# Patient Record
Sex: Female | Born: 1980 | Race: Black or African American | Hispanic: No | Marital: Single
Health system: Southern US, Community
[De-identification: ages and names within clinical notes are randomized; demographics above are authoritative.]

## PROBLEM LIST (undated history)

## (undated) ENCOUNTER — Inpatient Hospital Stay (HOSPITAL_COMMUNITY): Payer: Self-pay

## (undated) DIAGNOSIS — O24419 Gestational diabetes mellitus in pregnancy, unspecified control: Secondary | ICD-10-CM

## (undated) DIAGNOSIS — R87629 Unspecified abnormal cytological findings in specimens from vagina: Secondary | ICD-10-CM

## (undated) DIAGNOSIS — A5901 Trichomonal vulvovaginitis: Secondary | ICD-10-CM

## (undated) HISTORY — PX: NO PAST SURGERIES: SHX2092

## (undated) HISTORY — DX: Gestational diabetes mellitus in pregnancy, unspecified control: O24.419

---

## 2005-11-19 ENCOUNTER — Emergency Department (HOSPITAL_COMMUNITY): Admission: EM | Admit: 2005-11-19 | Discharge: 2005-11-19 | Payer: Self-pay | Admitting: Emergency Medicine

## 2008-02-21 ENCOUNTER — Emergency Department (HOSPITAL_COMMUNITY): Admission: EM | Admit: 2008-02-21 | Discharge: 2008-02-21 | Payer: Self-pay | Admitting: Emergency Medicine

## 2010-05-18 ENCOUNTER — Emergency Department (HOSPITAL_BASED_OUTPATIENT_CLINIC_OR_DEPARTMENT_OTHER)
Admission: EM | Admit: 2010-05-18 | Discharge: 2010-05-18 | Disposition: A | Discharge status: Home / Self Care | Payer: Self-pay | Attending: Emergency Medicine | Admitting: Emergency Medicine

## 2010-05-18 DIAGNOSIS — W2209XA Striking against other stationary object, initial encounter: Secondary | ICD-10-CM | POA: Insufficient documentation

## 2010-05-18 DIAGNOSIS — S61209A Unspecified open wound of unspecified finger without damage to nail, initial encounter: Secondary | ICD-10-CM | POA: Insufficient documentation

## 2010-10-11 ENCOUNTER — Emergency Department (HOSPITAL_BASED_OUTPATIENT_CLINIC_OR_DEPARTMENT_OTHER)
Admission: EM | Admit: 2010-10-11 | Discharge: 2010-10-11 | Disposition: A | Discharge status: Home / Self Care | Payer: BC Managed Care – PPO | Attending: Emergency Medicine | Admitting: Emergency Medicine

## 2010-10-11 DIAGNOSIS — N390 Urinary tract infection, site not specified: Secondary | ICD-10-CM

## 2010-10-11 DIAGNOSIS — R350 Frequency of micturition: Secondary | ICD-10-CM | POA: Insufficient documentation

## 2010-10-11 LAB — URINALYSIS, ROUTINE W REFLEX MICROSCOPIC
Glucose, UA: NEGATIVE mg/dL
Nitrite: POSITIVE — AB
Protein, ur: 30 mg/dL — AB

## 2010-10-11 LAB — URINE MICROSCOPIC-ADD ON

## 2010-10-11 MED ORDER — NITROFURANTOIN MONOHYD MACRO 100 MG PO CAPS
100.0000 mg | ORAL_CAPSULE | Freq: Once | ORAL | Status: AC
  Administered 2010-10-11: 100 mg via ORAL
  Filled 2010-10-11: qty 1

## 2010-10-11 MED ORDER — PHENAZOPYRIDINE HCL 200 MG PO TABS: 200.0000 mg | ORAL_TABLET | Freq: Three times a day (TID) | ORAL | Status: AC

## 2010-10-11 MED ORDER — NITROFURANTOIN MONOHYD MACRO 100 MG PO CAPS: 100.0000 mg | ORAL_CAPSULE | Freq: Two times a day (BID) | ORAL | Status: AC

## 2010-10-11 NOTE — ED Provider Notes (Signed)
History     CSN: [id number] Arrival date & time: 10/11/2010  2:08 AM  Chief Complaint  Patient presents with  . Urinary Frequency   HPI Comments: Dysuria and urinary frequency x1 day. Instructed over the counter these without significant relief. No prior history of urinary tract infections. She has no associated nausea, vomiting, fever, flank pain, suprapubic tenderness. There is no vaginal discharge or bleeding  Patient is a 30 y.o. female presenting with frequency. The history is provided by the patient. No language interpreter was used.  Urinary Frequency This is a new problem. The current episode started yesterday. The problem occurs constantly. The problem has been gradually worsening. Pertinent negatives include no chest pain, no abdominal pain, no headaches and no shortness of breath. Exacerbated by: urinating. The symptoms are relieved by nothing. Treatments tried: azo. The treatment provided no relief.    History reviewed. No pertinent past medical history.  History reviewed. No pertinent past surgical history.  History reviewed. No pertinent family history.  History  Substance Use Topics  . Smoking status: Never Smoker   . Smokeless tobacco: Never Used  . Alcohol Use: Yes     social drinker    OB History    Grav Para Term Preterm Abortions TAB SAB Ect Mult Living                  Review of Systems  Constitutional: Negative for fever, activity change, appetite change and fatigue.  HENT: Negative for sore throat, neck pain and neck stiffness.   Respiratory: Negative for chest tightness and shortness of breath.   Cardiovascular: Negative for chest pain and palpitations.  Gastrointestinal: Negative for nausea, vomiting and abdominal pain.  Genitourinary: Positive for dysuria, urgency and frequency. Negative for hematuria, flank pain, vaginal bleeding and vaginal discharge.  Neurological: Negative for dizziness, weakness, light-headedness, numbness and headaches.  All  other systems reviewed and are negative.    Physical Exam  BP 94/56  Pulse 76  Temp(Src) 98.2 F (36.8 C) (Oral)  Resp 16  Ht 5\' 5"  (1.651 m)  Wt 150 lb (68.04 kg)  BMI 24.96 kg/m2  SpO2 100%  LMP 09/20/2010  Physical Exam  Nursing note and vitals reviewed. Constitutional: She is oriented to person, place, and time. She appears well-developed and well-nourished. No distress.  HENT:  Head: Normocephalic and atraumatic.  Eyes: Conjunctivae are normal. Pupils are equal, round, and reactive to light.  Neck: Normal range of motion. Neck supple.  Cardiovascular: Normal rate, regular rhythm, normal heart sounds and intact distal pulses.  Exam reveals no gallop and no friction rub.   No murmur heard. Pulmonary/Chest: Effort normal and breath sounds normal. No respiratory distress.  Abdominal: Soft. Bowel sounds are normal. There is no tenderness.       No cva tenderness  Musculoskeletal: Normal range of motion.  Neurological: She is alert and oriented to person, place, and time.  Skin: Skin is warm and dry. No rash noted.    ED Course  Procedures  MDM  UTI Patient received her first dose of antibiotics and numerous department. I'll prescribe her Macrobid. A urine culture was sent. She has no prior history of urinary tract infections this will be uncomplicated. I will discharge her home with a prescription for Macrobid as well as Pyridium. She is provided instructions for which to return to the emergency department such as fever, flank pain, nausea, vomiting. She's instructed to followup with her primary care physician in one week as needed.  Dayton Bailiff, MD 10/11/10 [phone]

## 2010-10-11 NOTE — ED Notes (Signed)
Pt states that she had onset of discomfort with urination yesterday.  Pt states that urine has foul odor to it.  Pt states that she has been taking Azo for the discomfort, no improvement.

## 2010-10-13 LAB — URINE CULTURE: Colony Count: >=100000

## 2011-09-22 ENCOUNTER — Encounter (HOSPITAL_BASED_OUTPATIENT_CLINIC_OR_DEPARTMENT_OTHER): Payer: Self-pay | Admitting: Emergency Medicine

## 2011-09-22 ENCOUNTER — Emergency Department (HOSPITAL_BASED_OUTPATIENT_CLINIC_OR_DEPARTMENT_OTHER)
Admission: EM | Admit: 2011-09-22 | Discharge: 2011-09-22 | Disposition: A | Discharge status: Home / Self Care | Payer: Self-pay | Attending: Emergency Medicine | Admitting: Emergency Medicine

## 2011-09-22 DIAGNOSIS — N390 Urinary tract infection, site not specified: Secondary | ICD-10-CM | POA: Insufficient documentation

## 2011-09-22 LAB — URINALYSIS, ROUTINE W REFLEX MICROSCOPIC
Bilirubin Urine: NEGATIVE
Ketones, ur: NEGATIVE mg/dL
Nitrite: NEGATIVE
Protein, ur: NEGATIVE mg/dL
Specific Gravity, Urine: 1.017 (ref 1.005–1.030)
Urobilinogen, UA: 0.2 mg/dL (ref 0.0–1.0)

## 2011-09-22 LAB — PREGNANCY, URINE: Preg Test, Ur: NEGATIVE

## 2011-09-22 MED ORDER — CEPHALEXIN 500 MG PO CAPS: 500.0000 mg | ORAL_CAPSULE | Freq: Four times a day (QID) | ORAL | Status: AC

## 2011-09-22 NOTE — ED Notes (Signed)
Pt states that about 1 week ago she noticed a smell to her urine and pain started 3 days ago.

## 2011-09-22 NOTE — ED Provider Notes (Signed)
History     CSN: [id number]  Arrival date & time 09/22/11  1044   First MD Initiated Contact with Patient 09/22/11 1054      Chief Complaint  Patient presents with  . Urinary Tract Infection    (Consider location/radiation/quality/duration/timing/severity/associated sxs/prior treatment) HPI  Patient states that she has a urinary tract infection. She states that she noted a bad odor to her urine a week ago and took over-the-counter medication. She noted pain with urination that began 3 days ago. She has not had any pain, fever, chills, nausea or vomiting. Her last normal menstrual period was the beginning of August. She is sexually active but denies any vaginal discharge. She has had one urinary tract infection over a year ago.  History reviewed. No pertinent past medical history.  History reviewed. No pertinent past surgical history.  No family history on file.  History  Substance Use Topics  . Smoking status: Never Smoker   . Smokeless tobacco: Never Used  . Alcohol Use: Yes     social drinker    OB History    Grav Para Term Preterm Abortions TAB SAB Ect Mult Living                  Review of Systems  All other systems reviewed and are negative.    Allergies  Review of patient's allergies indicates no known allergies.  Home Medications   Current Outpatient Rx  Name Route Sig Dispense Refill  . LYSINE ACETATE PO Oral Take 2 tablets by mouth.      . AZO TABS PO Oral Take 2 tablets by mouth.        BP 116/75  Pulse 70  Temp 98.2 F (36.8 C) (Oral)  Resp 18  Ht 5\' 5"  (1.651 m)  Wt 150 lb (68.04 kg)  BMI 24.96 kg/m2  SpO2 100%  LMP 09/07/2011  Physical Exam  Nursing note and vitals reviewed. Constitutional: She is oriented to person, place, and time. She appears well-developed and well-nourished.  HENT:  Head: Normocephalic and atraumatic.  Eyes: Pupils are equal, round, and reactive to light.  Cardiovascular: Normal rate.   Pulmonary/Chest: Effort  normal.  Abdominal: Soft. Bowel sounds are normal.  Musculoskeletal:       No CVA tenderness.  Neurological: She is alert and oriented to person, place, and time.  Skin: Skin is warm and dry.  Psychiatric: She has a normal mood and affect.    ED Course  Procedures (including critical care time)  Labs Reviewed  URINALYSIS, ROUTINE W REFLEX MICROSCOPIC - Abnormal; Notable for the following:    APPearance CLOUDY (*)     Hgb urine dipstick SMALL (*)     Leukocytes, UA SMALL (*)     All other components within normal limits  URINE MICROSCOPIC-ADD ON - Abnormal; Notable for the following:    Squamous Epithelial / LPF FEW (*)     Bacteria, UA MANY (*)     All other components within normal limits  PREGNANCY, URINE   No results found.   No diagnosis found.    MDM  Discussed antibiotics with patient and she requests 3 day course. Plan Keflex. Patient advised of need to followup her symptoms return or she is worse anytime.       Hilario Quarry, MD 09/22/11 1137

## 2015-06-05 ENCOUNTER — Encounter (HOSPITAL_BASED_OUTPATIENT_CLINIC_OR_DEPARTMENT_OTHER): Payer: Self-pay | Admitting: *Deleted

## 2015-06-05 ENCOUNTER — Emergency Department (HOSPITAL_BASED_OUTPATIENT_CLINIC_OR_DEPARTMENT_OTHER)
Admission: EM | Admit: 2015-06-05 | Discharge: 2015-06-05 | Disposition: A | Discharge status: Home / Self Care | Payer: Self-pay | Attending: Emergency Medicine | Admitting: Emergency Medicine

## 2015-06-05 DIAGNOSIS — R112 Nausea with vomiting, unspecified: Secondary | ICD-10-CM | POA: Insufficient documentation

## 2015-06-05 DIAGNOSIS — R519 Headache, unspecified: Secondary | ICD-10-CM

## 2015-06-05 DIAGNOSIS — R51 Headache: Secondary | ICD-10-CM | POA: Insufficient documentation

## 2015-06-05 MED ORDER — IBUPROFEN 800 MG PO TABS
800.0000 mg | ORAL_TABLET | Freq: Once | ORAL | Status: AC
  Administered 2015-06-05: 800 mg via ORAL
  Filled 2015-06-05: qty 1

## 2015-06-05 MED ORDER — ONDANSETRON 4 MG PO TBDP: 4.0000 mg | ORAL_TABLET | Freq: Three times a day (TID) | ORAL | Status: DC | PRN

## 2015-06-05 MED ORDER — IBUPROFEN 100 MG/5ML PO SUSP: 800.0000 mg | Freq: Once | ORAL | Status: DC

## 2015-06-05 MED ORDER — IBUPROFEN 800 MG PO TABS: 800.0000 mg | ORAL_TABLET | Freq: Three times a day (TID) | ORAL | Status: DC

## 2015-06-05 MED ORDER — ONDANSETRON 4 MG PO TBDP
4.0000 mg | ORAL_TABLET | Freq: Once | ORAL | Status: AC
  Administered 2015-06-05: 4 mg via ORAL
  Filled 2015-06-05: qty 1

## 2015-06-05 NOTE — ED Provider Notes (Signed)
CSN: [id number]     Arrival date & time 06/05/15  1839 History   First MD Initiated Contact with Patient 06/05/15 1846     Chief Complaint  Patient presents with  . Headache    HPI   Melissa Wilkins is a 35 y.o. female with a PMH of headaches who presents to the ED with headache. She states her symptoms started yesterday morning and have been intermittent since that time. She notes associated photophobia, phonophobia, nausea, and vomiting. She states she has a history of headaches, and has similar symptoms almost 10 times per month. She notes currently, her symptoms feel consistent with her typical headache. She denies exacerbating factors. She states she typically takes ibuprofen, but has not taken any today. She denies dizziness, lightheadedness, vision changes, neck pain, numbness, weakness, paresthesia, abdominal pain.   History reviewed. No pertinent past medical history. History reviewed. No pertinent past surgical history. No family history on file. Social History  Substance Use Topics  . Smoking status: Never Smoker   . Smokeless tobacco: Never Used  . Alcohol Use: Yes     Comment: social drinker   OB History    No data available       Review of Systems  Constitutional: Negative for fever and chills.  Eyes: Negative for visual disturbance.  Gastrointestinal: Positive for nausea and vomiting. Negative for abdominal pain.  Musculoskeletal: Negative for neck pain.  Neurological: Positive for headaches. Negative for dizziness, syncope, weakness, light-headedness and numbness.  All other systems reviewed and are negative.     Allergies  Review of patient's allergies indicates no known allergies.  Home Medications   Prior to Admission medications   Medication Sig Start Date End Date Taking? Authorizing Provider  ibuprofen (ADVIL,MOTRIN) 800 MG tablet Take 1 tablet (800 mg total) by mouth 3 (three) times daily. 06/05/15   Mady GemmaElizabeth C Ahnna Dungan, PA-C  LYSINE ACETATE PO  Take 2 tablets by mouth.      Historical Provider, MD  ondansetron (ZOFRAN ODT) 4 MG disintegrating tablet Take 1 tablet (4 mg total) by mouth every 8 (eight) hours as needed for nausea. 06/05/15   Mady GemmaElizabeth C Claudie Rathbone, PA-C  Phenazopyridine HCl (AZO TABS PO) Take 2 tablets by mouth.      Historical Provider, MD    BP 118/91 mmHg  Pulse 78  Temp(Src) 97.8 F (36.6 C) (Oral)  Resp 18  Ht 5\' 5"  (1.651 m)  Wt 66.679 kg  BMI 24.46 kg/m2  SpO2 100%  LMP 05/14/2015 Physical Exam  Constitutional: She is oriented to person, place, and time. She appears well-developed and well-nourished. No distress.  HENT:  Head: Normocephalic and atraumatic.  Right Ear: External ear normal.  Left Ear: External ear normal.  Nose: Nose normal.  Mouth/Throat: Uvula is midline, oropharynx is clear and moist and mucous membranes are normal.  Eyes: Conjunctivae, EOM and lids are normal. Pupils are equal, round, and reactive to light. Right eye exhibits no discharge. Left eye exhibits no discharge. No scleral icterus.  Neck: Normal range of motion. Neck supple.  No nuchal rigidity.  Cardiovascular: Normal rate, regular rhythm, normal heart sounds, intact distal pulses and normal pulses.   Pulmonary/Chest: Effort normal and breath sounds normal. No respiratory distress. She has no wheezes. She has no rales.  Abdominal: Soft. Normal appearance and bowel sounds are normal. She exhibits no distension and no mass. There is no tenderness. There is no rigidity, no rebound and no guarding.  Musculoskeletal: Normal range of motion. She  exhibits no edema or tenderness.  Neurological: She is alert and oriented to person, place, and time. She has normal strength. No cranial nerve deficit or sensory deficit. Coordination normal.  Patient ambulates without difficulty.  Skin: Skin is warm, dry and intact. No rash noted. She is not diaphoretic. No erythema. No pallor.  Psychiatric: She has a normal mood and affect. Her speech is  normal and behavior is normal.  Nursing note and vitals reviewed.   ED Course  Procedures (including critical care time)  Labs Review Labs Reviewed - No data to display  Imaging Review No results found.    EKG Interpretation None      MDM   Final diagnoses:  Headache, unspecified headache type    35 year old female presents with headache. Notes history of headaches, and states her symptoms feel characteristic of her typical headache. Reports associated photophobia, phonophobia, nausea, vomiting. Denies dizziness, lightheadedness, vision changes, neck pain, numbness, weakness, paresthesia, abdominal pain.  Patient is afebrile. Vital signs stable. Normal neuro exam with no focal deficit. Patient relates that difficulty. No nuchal rigidity.  Patient declines IV due to fear of needles. Will give zofran and ibuprofen by mouth.  On reassessment of patient, she reports significant symptom improvement. She is able to tolerate PO intake. Patient is nontoxic and well-appearing, feel she is stable for discharge at this time. We will treat with zofran and ibuprofen at home. Patient to follow-up with PCP and with neurology for further management of frequent headaches. Strict return precautions discussed. Patient verbalizes her understanding and is in agreement with plan.  BP 118/91 mmHg  Pulse 78  Temp(Src) 97.8 F (36.6 C) (Oral)  Resp 18  Ht  (1.651 m)  Wt 66.679 kg  BMI 24.46 kg/m2  SpO2 100%  LMP 05/14/2015        Mady Gemma, PA-C 06/05/15 1952  Lavera Guise, MD 06/05/15 [phone]

## 2015-06-05 NOTE — ED Notes (Signed)
Pt verbalizes understanding of d/c instructions and denies any further needs at this time. 

## 2015-06-05 NOTE — ED Notes (Signed)
Headache since yesterday am. Vomiting.

## 2015-06-05 NOTE — ED Notes (Signed)
Pt c/o headache since yesterday with vomiting, has not tried any medication at home.

## 2015-06-05 NOTE — Discharge Instructions (Signed)
1. Medications: ibuprofen, zofran, usual home medications 2. Treatment: rest, drink plenty of fluids 3. Follow Up: please followup with your primary doctor and with neurology for discussion of your diagnoses and further evaluation after today's visit; if you do not have a primary care doctor use the phone number listed in your discharge paperwork to find one; please return to the ER for severe headache, numbness, weakness, new or worsening symptoms   Migraine Headache A migraine headache is very bad, throbbing pain on one or both sides of your head. Talk to your doctor about what things may bring on (trigger) your migraine headaches. HOME CARE  Only take medicines as told by your doctor.  Lie down in a dark, quiet room when you have a migraine.  Keep a journal to find out if certain things bring on migraine headaches. For example, write down:  What you eat and drink.  How much sleep you get.  Any change to your diet or medicines.  Lessen how much alcohol you drink.  Quit smoking if you smoke.  Get enough sleep.  Lessen any stress in your life.  Keep lights dim if bright lights bother you or make your migraines worse. GET HELP RIGHT AWAY IF:   Your migraine becomes really bad.  You have a fever.  You have a stiff neck.  You have trouble seeing.  Your muscles are weak, or you lose muscle control.  You lose your balance or have trouble walking.  You feel like you will pass out (faint), or you pass out.  You have really bad symptoms that are different than your first symptoms. MAKE SURE YOU:   Understand these instructions.  Will watch your condition.  Will get help right away if you are not doing well or get worse.   This information is not intended to replace advice given to you by your health care provider. Make sure you discuss any questions you have with your health care provider.   Document Released: 10/28/2007 Document Revised: 04/12/2011 Document Reviewed:  09/25/2012 Elsevier Interactive Patient Education [employer]2016 Elsevier Inc.

## 2015-06-05 NOTE — ED Notes (Signed)
Pt drinking apple juice without issue. 

## 2015-09-12 ENCOUNTER — Ambulatory Visit: Payer: Self-pay

## 2016-08-15 ENCOUNTER — Emergency Department (HOSPITAL_BASED_OUTPATIENT_CLINIC_OR_DEPARTMENT_OTHER)
Admission: EM | Admit: 2016-08-15 | Discharge: 2016-08-16 | Disposition: A | Discharge status: Home / Self Care | Payer: BLUE CROSS/BLUE SHIELD | Attending: Emergency Medicine | Admitting: Emergency Medicine

## 2016-08-15 ENCOUNTER — Encounter (HOSPITAL_BASED_OUTPATIENT_CLINIC_OR_DEPARTMENT_OTHER): Payer: Self-pay | Admitting: Emergency Medicine

## 2016-08-15 DIAGNOSIS — R300 Dysuria: Secondary | ICD-10-CM | POA: Diagnosis not present

## 2016-08-15 DIAGNOSIS — N760 Acute vaginitis: Secondary | ICD-10-CM | POA: Diagnosis not present

## 2016-08-15 DIAGNOSIS — N390 Urinary tract infection, site not specified: Secondary | ICD-10-CM | POA: Diagnosis not present

## 2016-08-15 DIAGNOSIS — R350 Frequency of micturition: Secondary | ICD-10-CM | POA: Diagnosis present

## 2016-08-15 DIAGNOSIS — B9689 Other specified bacterial agents as the cause of diseases classified elsewhere: Secondary | ICD-10-CM

## 2016-08-15 LAB — PREGNANCY, URINE: Preg Test, Ur: NEGATIVE

## 2016-08-15 LAB — URINALYSIS, MICROSCOPIC (REFLEX)

## 2016-08-15 LAB — WET PREP, GENITAL
Sperm: NONE SEEN
Trich, Wet Prep: NONE SEEN
YEAST WET PREP: NONE SEEN

## 2016-08-15 LAB — URINALYSIS, ROUTINE W REFLEX MICROSCOPIC
Bilirubin Urine: NEGATIVE
GLUCOSE, UA: NEGATIVE mg/dL
Ketones, ur: NEGATIVE mg/dL
Nitrite: NEGATIVE
PH: 5 (ref 5.0–8.0)
PROTEIN: NEGATIVE mg/dL
SPECIFIC GRAVITY, URINE: 1.019 (ref 1.005–1.030)

## 2016-08-15 MED ORDER — CEPHALEXIN 500 MG PO CAPS: 500.0000 mg | ORAL_CAPSULE | Freq: Four times a day (QID) | ORAL | 0 refills | Status: DC

## 2016-08-15 MED ORDER — METRONIDAZOLE 500 MG PO TABS: 500.0000 mg | ORAL_TABLET | Freq: Two times a day (BID) | ORAL | 0 refills | Status: AC

## 2016-08-15 NOTE — ED Notes (Signed)
ED Provider at bedside. 

## 2016-08-15 NOTE — ED Provider Notes (Signed)
[facility]-EMERGENCY DEPT Provider Note   CSN: [id number] Arrival date & time: 08/15/16  2215  By signing my name below, I, Ny'Kea Lewis, attest that this documentation has been prepared under the direction and in the presence of , Lyndel Safe, [place]. Electronically Signed: Karren Cobble, ED Scribe. 08/15/16. 11:03 PM.  History   Chief Complaint Chief Complaint  Patient presents with  . Urinary Frequency   The history is provided by the patient. No language interpreter was used.    HPI Comments: Melissa Wilkins is a 36 y.o. female who presents to the Emergency Department complaining of gradually worsening dysuria that began five days ago. Pt notes associated left sided back pain. She reports five days ago she began to experience burning after urination. She took OTC Azo for her symptoms and they resolved, but she reports they returned. She attempted to make an appointment with her PCP, but they were booked. Pt also reports having some vaginal discharge that she believes to be associated with bacteria vaginosis, which she has a history of.  Denies fever, chills, nausea, urinary frequency or urgency.   History reviewed. No pertinent past medical history.  There are no active problems to display for this patient.   History reviewed. No pertinent surgical history.  OB History    No data available       Home Medications    Prior to Admission medications   Medication Sig Start Date End Date Taking? Authorizing Provider  cephALEXin (KEFLEX) 500 MG capsule Take 1 capsule (500 mg total) by mouth 4 (four) times daily. 08/15/16   Cristina Gong, PA-C  ibuprofen (ADVIL,MOTRIN) 800 MG tablet Take 1 tablet (800 mg total) by mouth 3 (three) times daily. 06/05/15   Mady Gemma, PA-C  LYSINE ACETATE PO Take 2 tablets by mouth.      [provider]  metroNIDAZOLE (FLAGYL) 500 MG tablet Take 1 tablet (500 mg total) by mouth 2 (two) times daily. 08/15/16 08/20/16  Cristina Gong, PA-C  ondansetron (ZOFRAN ODT) 4 MG disintegrating tablet Take 1 tablet (4 mg total) by mouth every 8 (eight) hours as needed for nausea. 06/05/15   Mady Gemma, PA-C  Phenazopyridine HCl (AZO TABS PO) Take 2 tablets by mouth.      [provider]    Family History No family history on file.  Social History Social History  Substance Use Topics  . Smoking status: Never Smoker  . Smokeless tobacco: Never Used  . Alcohol use Yes     Comment: social drinker     Allergies   Patient has no known allergies.   Review of Systems Review of Systems  Constitutional: Negative for chills and fever.  HENT: Negative for ear pain and sore throat.   Eyes: Negative for pain and visual disturbance.  Respiratory: Negative for cough and shortness of breath.   Cardiovascular: Negative for chest pain and palpitations.  Gastrointestinal: Negative for abdominal pain, nausea and vomiting.  Genitourinary: Positive for dysuria and vaginal discharge. Negative for difficulty urinating, frequency, hematuria, urgency, vaginal bleeding and vaginal pain.  Musculoskeletal: Negative for arthralgias and back pain.  Skin: Negative for color change and rash.  Neurological: Negative for seizures, syncope and headaches.  All other systems reviewed and are negative.    Physical Exam Updated Vital Signs BP 118/86 (BP Location: Right Arm)   Pulse 82   Temp 98.3 F (36.8 C) (Oral)   Resp 18   LMP 07/23/2016   SpO2 100%  Physical Exam  Constitutional: She is oriented to person, place, and time. She appears well-developed and well-nourished. No distress.  HENT:  Head: Normocephalic and atraumatic.  Eyes: Conjunctivae are normal. No scleral icterus.  Neck: Normal range of motion.  Cardiovascular: Normal rate and regular rhythm.   Pulmonary/Chest: Effort normal. No stridor. No respiratory distress.  Abdominal: Soft. Normal appearance and bowel sounds are normal. She  exhibits no distension. There is no tenderness. There is no rigidity, no rebound, no guarding and no CVA tenderness.  Genitourinary: Uterus normal. Pelvic exam was performed with patient supine. There is no lesion on the right labia. There is no lesion on the left labia. Cervix exhibits no motion tenderness. Right adnexum displays no mass, no tenderness and no fullness. Left adnexum displays no mass, no tenderness and no fullness. No erythema, tenderness or bleeding in the vagina. Vaginal discharge found.  Genitourinary Comments: Chaperone present.  Musculoskeletal: Normal range of motion. She exhibits no edema or deformity.  No CVA tenderness.  Neurological: She is alert and oriented to person, place, and time. She exhibits normal muscle tone.  Skin: Skin is warm and dry. She is not diaphoretic.  Psychiatric: She has a normal mood and affect. Her behavior is normal.  Nursing note and vitals reviewed.   ED Treatments / Results  DIAGNOSTIC STUDIES: Oxygen Saturation is 100% on RA, normal by my interpretation.   COORDINATION OF CARE: 10:59 PM-Discussed next steps with pt. Pt verbalized understanding and is agreeable with the plan.   Labs (all labs ordered are listed, but only abnormal results are displayed) Labs Reviewed  WET PREP, GENITAL - Abnormal; Notable for the following:       Result Value   Clue Cells Wet Prep HPF POC PRESENT (*)    WBC, Wet Prep HPF POC MANY (*)    All other components within normal limits  URINALYSIS, ROUTINE W REFLEX MICROSCOPIC - Abnormal; Notable for the following:    APPearance CLOUDY (*)    Hgb urine dipstick LARGE (*)    Leukocytes, UA LARGE (*)    All other components within normal limits  URINALYSIS, MICROSCOPIC (REFLEX) - Abnormal; Notable for the following:    Bacteria, UA MANY (*)    Squamous Epithelial / LPF 6-30 (*)    All other components within normal limits  PREGNANCY, URINE  GC/CHLAMYDIA PROBE AMP [facility]    EKG   EKG Interpretation None       Radiology No results found.  Procedures Procedures (including critical care time)  Medications Ordered in ED Medications - No data to display   Initial Impression / Assessment and Plan / ED Course  I have reviewed the triage vital signs and the nursing notes.  Pertinent labs & imaging results that were available during my care of the patient were reviewed by me and considered in my medical decision making (see chart for details).    Pt has been diagnosed with a UTI. Pt is afebrile, no CVA tenderness, normotensive, and denies N/V.   Pt understands that they have GC/Chlamydia cultures pending and that they will need to inform all sexual partners if results return positive. Patient refused empiric treatment with azithromycin and Rocephin.  Pt not concerning for PID because hemodynamically stable and no cervical motion tenderness on pelvic exam. Pt has also been treated with flagyl for Bacterial Vaginosis. Pt has been advised to not drink alcohol while on this medication.  Pt to be dc home with antibiotics and  instructions to follow up with PCP if symptoms persist.   Patient has been informed that all antibiotics may decrease the effectiveness of hormonal based contraceptives, and that if she uses any she normally that she would need to use backup protection to avoid pregnancy.   Final Clinical Impressions(s) / ED Diagnoses   Final diagnoses:  BV (bacterial vaginosis)  Lower urinary tract infectious disease  Dysuria    New Prescriptions Discharge Medication List as of 08/15/2016 11:58 PM    START taking these medications   Details  cephALEXin (KEFLEX) 500 MG capsule Take 1 capsule (500 mg total) by mouth 4 (four) times daily., Starting Sun 08/15/2016, Print    metroNIDAZOLE (FLAGYL) 500 MG tablet Take 1 tablet (500 mg total) by mouth 2 (two) times daily., Starting Sun 08/15/2016, Until Fri 08/20/2016, Print         Cristina Gong,  PA-C 08/17/16 Salley Hews, MD 08/22/16 [phone]

## 2016-08-15 NOTE — ED Triage Notes (Signed)
Pt presents with c/o of frequent urination and burning. And lower back pain

## 2016-08-15 NOTE — Discharge Instructions (Signed)
Today your diagnosed with bacterial vaginosis and received a prescription for metronidazole also known as Flagyl. It is very important that you do not consume any alcohol while taking this medication as it will cause you to become violently ill.  The test to determine if you have gonorrhea or chlamydia will take a few days. They will only call you if your tests come back positive, no news is good news.

## 2016-08-17 LAB — GC/CHLAMYDIA PROBE AMP (~~LOC~~) NOT AT ARMC
Chlamydia: NEGATIVE
NEISSERIA GONORRHEA: NEGATIVE

## 2017-02-01 NOTE — L&D Delivery Note (Signed)
Delivery Note At 2320 a 20+[redacted] week gestation non-viable female was delivered via  (Presentation:breech ;  ).  APGAR:0 ,0 ; weight  .   Placenta status: intact spontaneous , .  Cord:  with the following complications: IUFD.   Anesthesia:   Episiotomy:   Lacerations:   Suture Repair:  Est. Blood Loss (mL):  664 cc weighed  Mom to postpartum.  Baby to [place].  Lori A Clemmons CNM 06/11/2017, 12:24 AM

## 2017-03-28 LAB — OB RESULTS CONSOLE RUBELLA ANTIBODY, IGM: Rubella: IMMUNE

## 2017-03-28 LAB — OB RESULTS CONSOLE ABO/RH: RH TYPE: POSITIVE

## 2017-03-28 LAB — OB RESULTS CONSOLE GC/CHLAMYDIA
Chlamydia: NEGATIVE
Gonorrhea: NEGATIVE

## 2017-03-28 LAB — OB RESULTS CONSOLE HEPATITIS B SURFACE ANTIGEN: HEP B S AG: NEGATIVE

## 2017-03-28 LAB — OB RESULTS CONSOLE RPR: RPR: NONREACTIVE

## 2017-03-28 LAB — OB RESULTS CONSOLE HIV ANTIBODY (ROUTINE TESTING): HIV: NONREACTIVE

## 2017-04-20 ENCOUNTER — Encounter (HOSPITAL_COMMUNITY): Payer: Self-pay

## 2017-04-20 ENCOUNTER — Other Ambulatory Visit: Payer: Self-pay

## 2017-04-20 ENCOUNTER — Inpatient Hospital Stay (HOSPITAL_COMMUNITY)
Admission: AD | Admit: 2017-04-20 | Discharge: 2017-04-20 | Disposition: A | Discharge status: Home / Self Care | Payer: BLUE CROSS/BLUE SHIELD | Source: Ambulatory Visit | Attending: Obstetrics and Gynecology | Admitting: Obstetrics and Gynecology

## 2017-04-20 DIAGNOSIS — Z79899 Other long term (current) drug therapy: Secondary | ICD-10-CM | POA: Diagnosis not present

## 2017-04-20 DIAGNOSIS — O26891 Other specified pregnancy related conditions, first trimester: Secondary | ICD-10-CM | POA: Diagnosis not present

## 2017-04-20 DIAGNOSIS — N898 Other specified noninflammatory disorders of vagina: Secondary | ICD-10-CM | POA: Insufficient documentation

## 2017-04-20 DIAGNOSIS — Z791 Long term (current) use of non-steroidal anti-inflammatories (NSAID): Secondary | ICD-10-CM | POA: Diagnosis not present

## 2017-04-20 DIAGNOSIS — Z3A13 13 weeks gestation of pregnancy: Secondary | ICD-10-CM | POA: Insufficient documentation

## 2017-04-20 DIAGNOSIS — O26892 Other specified pregnancy related conditions, second trimester: Secondary | ICD-10-CM

## 2017-04-20 LAB — URINALYSIS, ROUTINE W REFLEX MICROSCOPIC
Bilirubin Urine: NEGATIVE
Glucose, UA: NEGATIVE mg/dL
HGB URINE DIPSTICK: NEGATIVE
Ketones, ur: NEGATIVE mg/dL
NITRITE: NEGATIVE
PROTEIN: NEGATIVE mg/dL
Specific Gravity, Urine: 1.01 (ref 1.005–1.030)
pH: 5 (ref 5.0–8.0)

## 2017-04-20 LAB — WET PREP, GENITAL
CLUE CELLS WET PREP: NONE SEEN
SPERM: NONE SEEN
TRICH WET PREP: NONE SEEN
YEAST WET PREP: NONE SEEN

## 2017-04-20 NOTE — MAU Note (Signed)
Pt presents to MAU with c/o brown/pink discharge that she noticed 2 weeks ago. Pt denies abdominal pain.

## 2017-04-20 NOTE — MAU Provider Note (Signed)
Chief Complaint: Vaginal Discharge   None    SUBJECTIVE HPI: Melissa Wilkins is a 37 y.o. G1P0 at 9474w0d who presents to Maternity Admissions reporting brown discharge. Pt had US on 04/12/2017 with no problems.  Discharge was present before discharge.  Location: vaginal Quality: moderate Severity: 0/10 on pain scale Duration: 2 weels   History reviewed. No pertinent past medical history. OB History  Gravida Para Term Preterm AB Living  1            SAB TAB Ectopic Multiple Live Births               # Outcome Date GA Lbr Len/2nd Weight Sex Delivery Anes PTL Lv  1 Current              Past Surgical History:  Procedure Laterality Date  . NO PAST SURGERIES     Social History   Socioeconomic History  . Marital status: Single    Spouse name: Not on file  . Number of children: Not on file  . Years of education: Not on file  . Highest education level: Not on file  Social Needs  . Financial resource strain: Not on file  . Food insecurity - worry: Not on file  . Food insecurity - inability: Not on file  . Transportation needs - medical: Not on file  . Transportation needs - non-medical: Not on file  Occupational History  . Not on file  Tobacco Use  . Smoking status: Never Smoker  . Smokeless tobacco: Never Used  Substance and Sexual Activity  . Alcohol use: Yes    Comment: social drinker  . Drug use: No  . Sexual activity: Yes    Birth control/protection: None  Other Topics Concern  . Not on file  Social History Narrative  . Not on file   History reviewed. No pertinent family history. No current facility-administered medications on file prior to encounter.    Current Outpatient Medications on File Prior to Encounter  Medication Sig Dispense Refill  . cephALEXin (KEFLEX) 500 MG capsule Take 1 capsule (500 mg total) by mouth 4 (four) times daily. 20 capsule 0  . ibuprofen (ADVIL,MOTRIN) 800 MG tablet Take 1 tablet (800 mg total) by mouth 3 (three) times daily. 21  tablet 0  . LYSINE ACETATE PO Take 2 tablets by mouth.      . ondansetron (ZOFRAN ODT) 4 MG disintegrating tablet Take 1 tablet (4 mg total) by mouth every 8 (eight) hours as needed for nausea. 10 tablet 0  . Phenazopyridine HCl (AZO TABS PO) Take 2 tablets by mouth.       No Known Allergies  I have reviewed patient's Past Medical Hx, Surgical Hx, Family Hx, Social Hx, medications and allergies.   Review of Systems  Constitutional: Negative.   HENT: Negative.   Eyes: Negative.   Respiratory: Negative.   Cardiovascular: Negative.   Gastrointestinal: Negative.   Endocrine: Negative.   Genitourinary: Positive for vaginal discharge.  Musculoskeletal: Negative.   Allergic/Immunologic: Negative.   Neurological: Negative.   Hematological: Negative.   Psychiatric/Behavioral: Negative.     OBJECTIVE Patient Vitals for the past 24 hrs:  BP Temp Temp src Pulse Resp Height Weight  04/20/17 2137 121/74 98.4 F (36.9 C) Oral 91 18 - -  04/20/17 2135 - - - - - 5\' 5"  (1.651 m) 80.3 kg (177 lb)   Constitutional: Well-developed, well-nourished female in no acute distress.  GI: Abd soft, non-tender, gravid appropriate for gestational  age. Pos BS x 4 MS: Extremities nontender, no edema, normal ROM Neurologic: Alert and oriented x 4.  GU: Neg CVAT.  SPECULUM EXAM: NEFG, brown mucoid discharge, no blood noted, cervix clean  BIMANUAL: cervix LTC; uterus 13 week size, no adnexal tenderness or masses.  No CMT.  LAB RESULTS Results for orders placed or performed during the hospital encounter of 04/20/17 (from the past 24 hour(s))  Urinalysis, Routine w reflex microscopic     Status: Abnormal   Collection Time: 04/20/17  9:27 PM  Result Value Ref Range   Color, Urine YELLOW YELLOW   APPearance CLEAR CLEAR   Specific Gravity, Urine 1.010 1.005 - 1.030   pH 5.0 5.0 - 8.0   Glucose, UA NEGATIVE NEGATIVE mg/dL   Hgb urine dipstick NEGATIVE NEGATIVE   Bilirubin Urine NEGATIVE NEGATIVE   Ketones,  ur NEGATIVE NEGATIVE mg/dL   Protein, ur NEGATIVE NEGATIVE mg/dL   Nitrite NEGATIVE NEGATIVE   Leukocytes, UA MODERATE (A) NEGATIVE   RBC / HPF 0-5 0 - 5 RBC/hpf   WBC, UA 6-30 0 - 5 WBC/hpf   Bacteria, UA RARE (A) NONE SEEN   Squamous Epithelial / LPF 0-5 (A) NONE SEEN   Mucus PRESENT   wet mount negative  IMAGING None  MAU COURSE Orders Placed This Encounter  Procedures  . Wet prep, genital  . Urinalysis, Routine w reflex microscopic   No orders of the defined types were placed in this encounter.   MDM PE urine and wet mount reviewed.  All negative. ASSESSMENT Vaginal discharge in pregnancy  PLAN Discharge home in stable condition. Bleeding  Precautions.  Keep regular appt  PNV   Kenney Houseman, CNM 04/20/2017  10:07 PM

## 2017-04-21 LAB — GC/CHLAMYDIA PROBE AMP (~~LOC~~) NOT AT ARMC
Chlamydia: NEGATIVE
Neisseria Gonorrhea: NEGATIVE

## 2017-05-04 ENCOUNTER — Inpatient Hospital Stay (HOSPITAL_COMMUNITY)
Admission: AD | Admit: 2017-05-04 | Discharge: 2017-05-04 | Disposition: A | Discharge status: Home / Self Care | Payer: BLUE CROSS/BLUE SHIELD | Source: Ambulatory Visit | Attending: Obstetrics and Gynecology | Admitting: Obstetrics and Gynecology

## 2017-05-04 ENCOUNTER — Other Ambulatory Visit: Payer: Self-pay

## 2017-05-04 ENCOUNTER — Inpatient Hospital Stay (HOSPITAL_COMMUNITY): Payer: BLUE CROSS/BLUE SHIELD

## 2017-05-04 ENCOUNTER — Encounter (HOSPITAL_COMMUNITY): Payer: Self-pay | Admitting: *Deleted

## 2017-05-04 DIAGNOSIS — O208 Other hemorrhage in early pregnancy: Secondary | ICD-10-CM | POA: Insufficient documentation

## 2017-05-04 DIAGNOSIS — Z3A15 15 weeks gestation of pregnancy: Secondary | ICD-10-CM | POA: Diagnosis not present

## 2017-05-04 DIAGNOSIS — O4692 Antepartum hemorrhage, unspecified, second trimester: Secondary | ICD-10-CM

## 2017-05-04 DIAGNOSIS — N898 Other specified noninflammatory disorders of vagina: Secondary | ICD-10-CM | POA: Diagnosis present

## 2017-05-04 LAB — WET PREP, GENITAL
Clue Cells Wet Prep HPF POC: NONE SEEN
Sperm: NONE SEEN
Trich, Wet Prep: NONE SEEN
Yeast Wet Prep HPF POC: NONE SEEN

## 2017-05-04 LAB — URINALYSIS, ROUTINE W REFLEX MICROSCOPIC
Bacteria, UA: NONE SEEN
Bilirubin Urine: NEGATIVE
Glucose, UA: NEGATIVE mg/dL
Ketones, ur: NEGATIVE mg/dL
Nitrite: NEGATIVE
Protein, ur: 100 mg/dL — AB
Specific Gravity, Urine: 1.016 (ref 1.005–1.030)
pH: 6 (ref 5.0–8.0)

## 2017-05-04 LAB — CBC
HCT: 31.8 % — ABNORMAL LOW (ref 36.0–46.0)
Hemoglobin: 11.3 g/dL — ABNORMAL LOW (ref 12.0–15.0)
MCH: 30.7 pg (ref 26.0–34.0)
MCHC: 35.5 g/dL (ref 30.0–36.0)
MCV: 86.4 fL (ref 78.0–100.0)
Platelets: 240 10*3/uL (ref 150–400)
RBC: 3.68 MIL/uL — ABNORMAL LOW (ref 3.87–5.11)
RDW: 13 % (ref 11.5–15.5)
WBC: 10.7 10*3/uL — ABNORMAL HIGH (ref 4.0–10.5)

## 2017-05-04 IMAGING — US US MFM OB LIMITED
1 series · 15 of 28 positions shown · non-contrast
Comparison: none

[Series 1: us mfm ob limited · 15 of 46 slices shown]
[im 1/46]
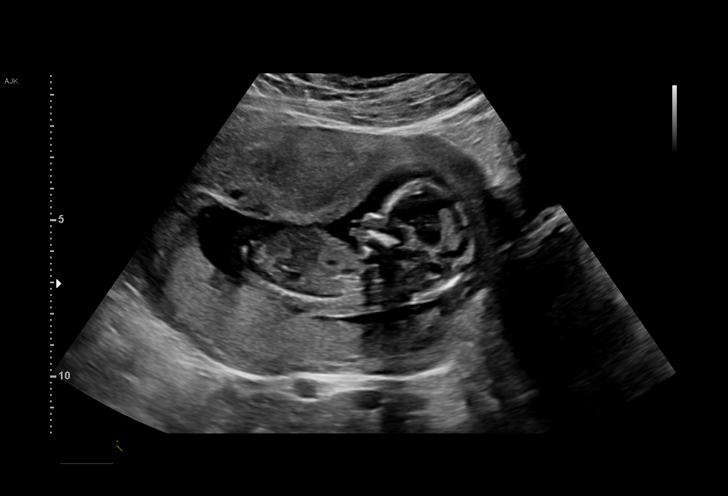
[im 4/46]
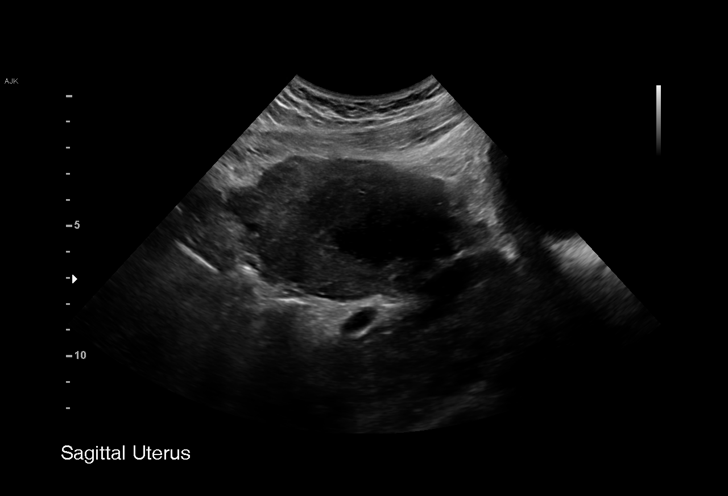
[im 7/46]
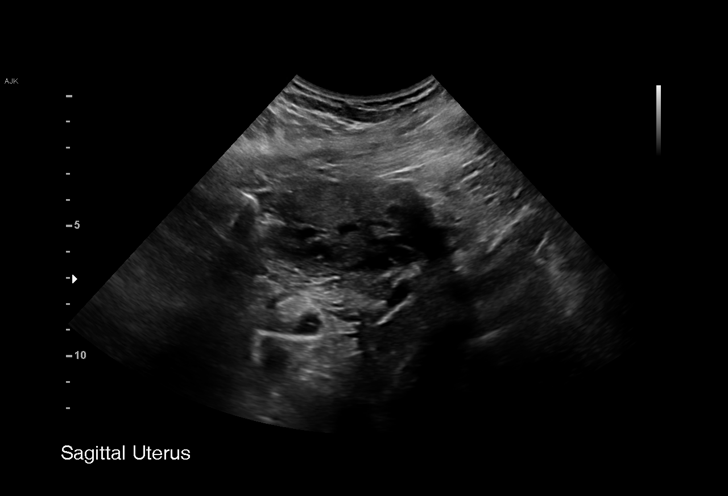
[im 11/46]
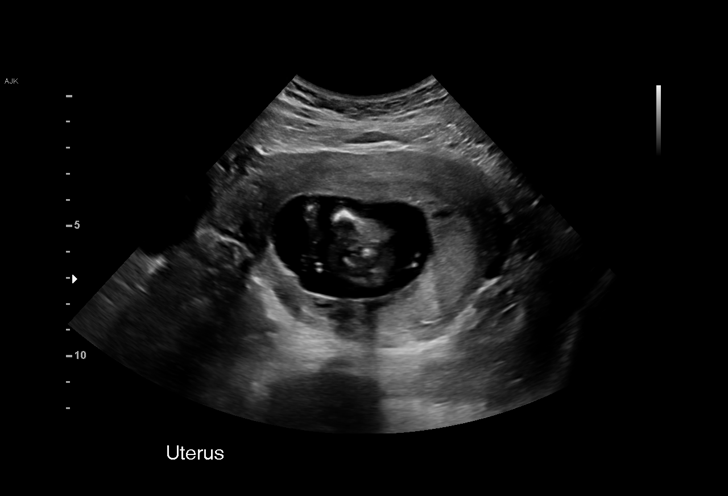
[im 14/46]
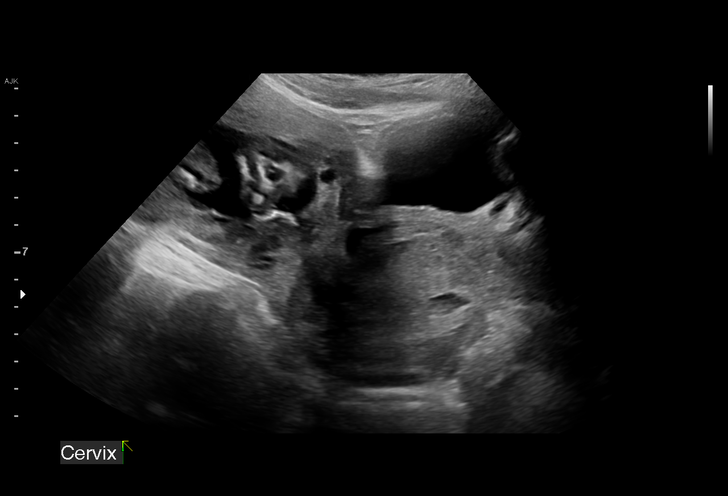
[im 17/46]
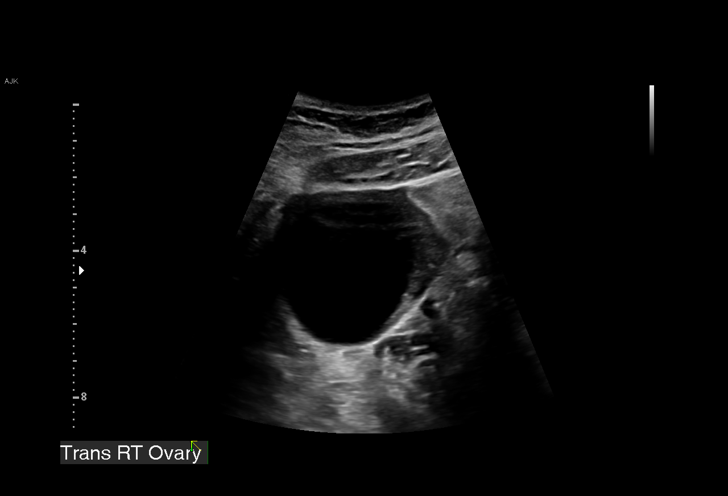
[im 21/46]
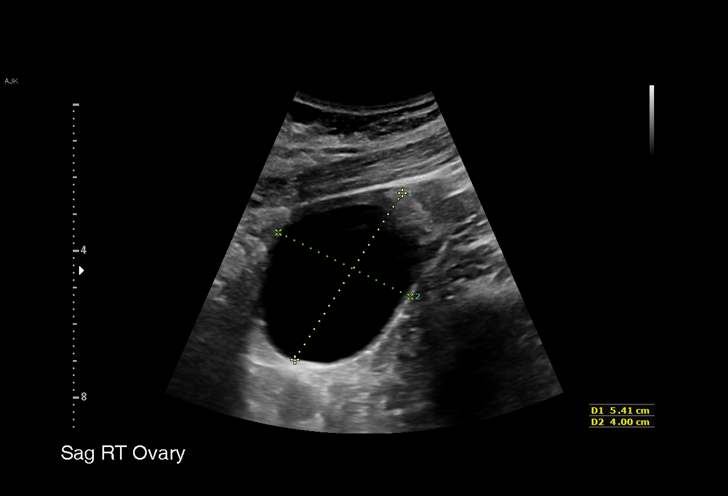
[im 24/46]
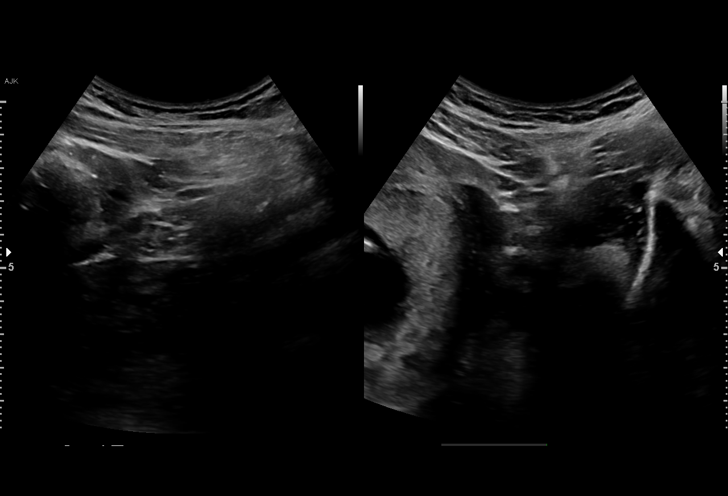
[im 26/46]
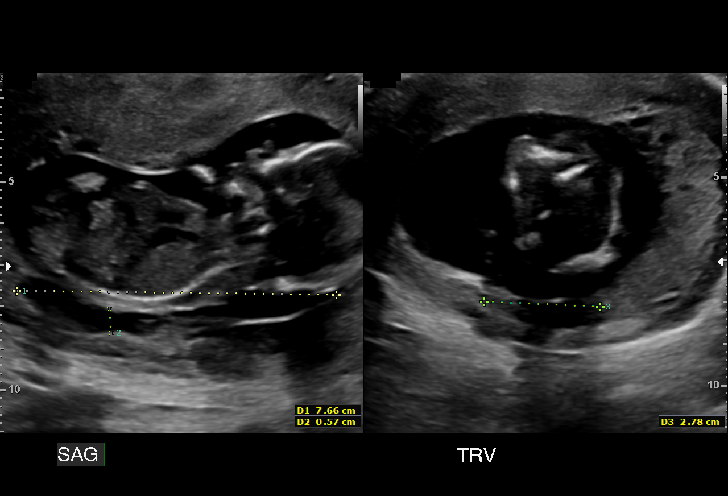
[im 29/46]
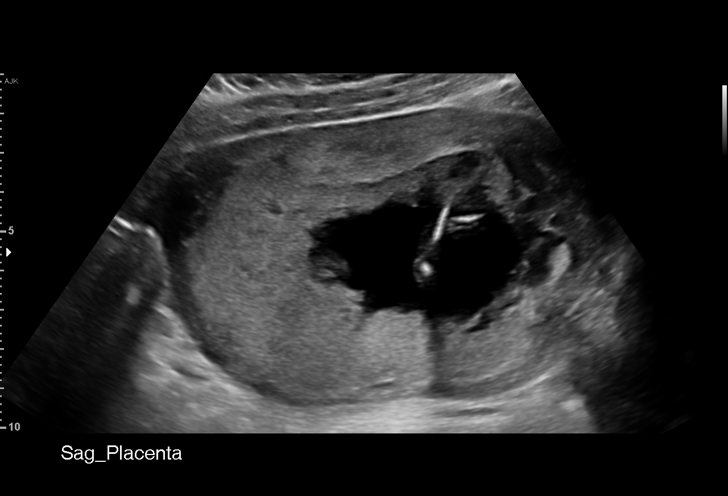
[im 32/46]
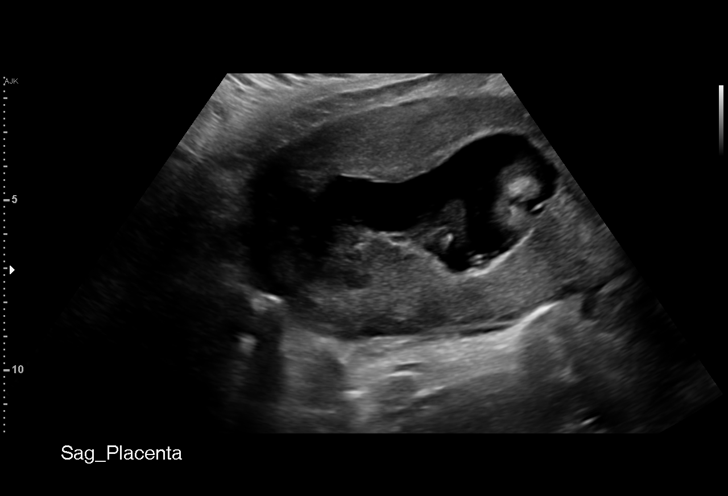
[im 36/46]
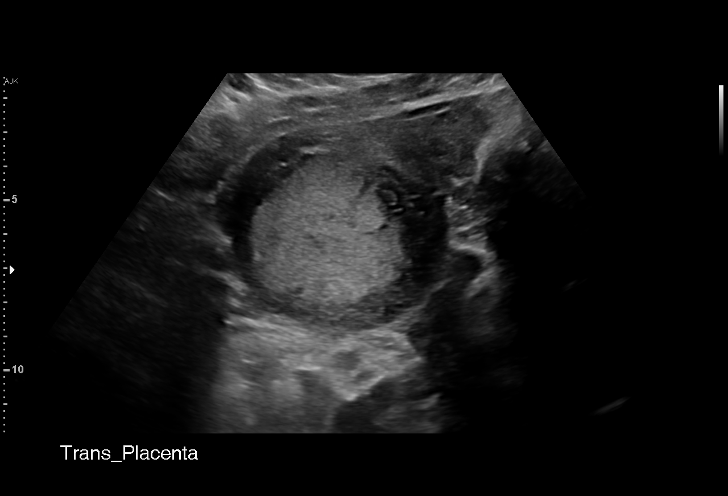
[im 39/46]
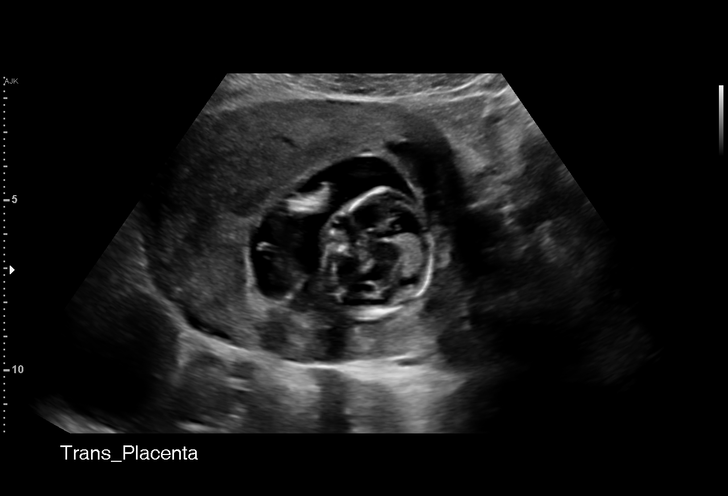
[im 42/46]
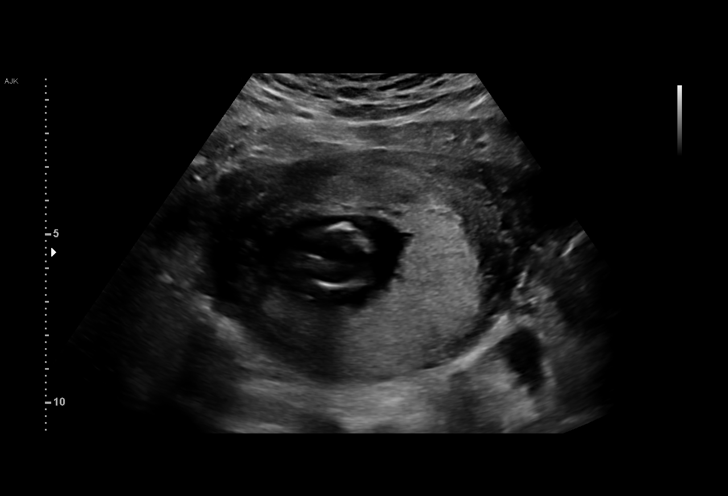
[im 46/46]
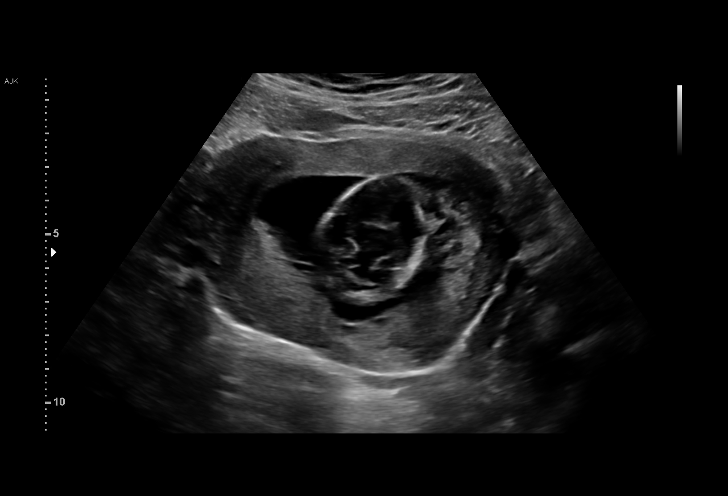

[15 of 28 positions shown; findings below may reference images not displayed]

1  OXENDINE            [PHONE_NUMBER]      [PHONE_NUMBER]     [PHONE_NUMBER]
Indications

15 weeks gestation of pregnancy
Vaginal bleeding in pregnancy, second          [T1]
trimester
Fetal Evaluation

Num Of Fetuses:     1
Fetal Heart         162
Rate(bpm):
Cardiac Activity:   Observed
Presentation:       Cephalic
Placenta:           Posterior
P. Cord Insertion:  Visualized

Amniotic Fluid
AFI FV:      Subjectively within normal limits

Comment:    Small subchorionic hemorrhage noted. (7.7x 0.6x 2.8cm)
Gestational Age

LMP:           40w 5d        Date:  [DATE]                 EDD:   [DATE]
Clinical EDD:  15w 0d                                        EDD:   [DATE]
Best:          15w 0d     Det. By:  Clinical EDD             EDD:   [DATE]
Cervix Uterus Adnexa

Cervix
Appears closed, without funnelling.

Uterus
No abnormality visualized.

Left Ovary
Not visualized.
Right Ovary
Simple cyst, measuring (4.7x 3.9x 4.3cm)
Impression

Singleton intrauterine pregnancy at 15 weeks 0 days
gestation with fetal cardiac activity
Agree with assigned EDD of  based on ultrasound
examination today
No evidence of intrauterine bleeding noted on ultrasound
today
Normal appearing adnexa other than a simple right ovarian
cyst 
 4.7 x 3.9 x 4.3 cm
Recommendations

Follow-up ultrasounds as clinically indicated.

## 2017-05-04 NOTE — MAU Provider Note (Signed)
  History     CSN: [id number]  Arrival date and time: 05/04/17 2011   First Provider Initiated Contact with Patient 05/04/17 2107      Chief Complaint  Patient presents with  . Vaginal Bleeding   Melissa Wilkins 37 y.o. G1P0 @ 6271w0d presents for Pt states she been having brown discharge for approximately a month. Heavy bleeding started today about an hour ago. Pt denies pain.            History reviewed. No pertinent past medical history.  Past Surgical History:  Procedure Laterality Date  . NO PAST SURGERIES      Family History  Family history unknown: Yes    Social History   Tobacco Use  . Smoking status: Never Smoker  . Smokeless tobacco: Never Used  Substance Use Topics  . Alcohol use: Yes    Comment: social drinker  . Drug use: No    Allergies: No Known Allergies  Medications Prior to Admission  Medication Sig Dispense Refill Last Dose  . ondansetron (ZOFRAN ODT) 4 MG disintegrating tablet Take 1 tablet (4 mg total) by mouth every 8 (eight) hours as needed for nausea. 10 tablet 0     Review of Systems  Genitourinary: Positive for vaginal bleeding.  All other systems reviewed and are negative.  Physical Exam   Blood pressure 137/73, pulse 91, temperature 98.1 F (36.7 C), temperature source Oral, resp. rate 19, last menstrual period 07/23/2016, SpO2 100 %.  Physical Exam  Nursing note and vitals reviewed. Constitutional: She is oriented to person, place, and time. She appears well-developed and well-nourished.  HENT:  Head: Normocephalic.  Cardiovascular: Normal rate.  Respiratory: Breath sounds normal. No respiratory distress.  GI: Soft. There is no tenderness.  Genitourinary:  Genitourinary Comments: 2 bright red large clots removed from vagina thru speculum. Digital exam -cervix dimple but closed  Musculoskeletal: Normal range of motion.  Neurological: She is alert and oriented to person, place, and time.  Skin: Skin is warm and dry.   Psychiatric: She has a normal mood and affect. Her behavior is normal.   MAU Course  Procedures  MDM Speculum exam reveals large amount of vaginal bleeding and 2 large sized clots. Pt reports no cramping or pain. States she has had vaginal bleeding for over a month but it was brown discharge. Has been evaluated in [place] and with Dr. Mora ApplPinn with multiple ultrasounds that revealed no cause.  Assessment and Plan  Vaginal Bleeding in 2nd trimester Subchorhionic hemmorhage  Ultrasound Bleeding precautions Follow up at [facility] 05/05/17  Berry Godsey A Airon Sahni 05/04/2017, 9:14 PM

## 2017-05-04 NOTE — MAU Note (Signed)
Pt states she been having brown discharge for approximately a month. Heavy bleeding started today about an hour ago. Pt denies pain.

## 2017-05-04 NOTE — MAU Note (Signed)
Pt here with c/o vaginal bleeding that started about an hour ago. Had some brown spotting about a month ago, but today it's red.

## 2017-05-04 NOTE — Discharge Instructions (Signed)
Vaginal Bleeding During Pregnancy, Second Trimester °A small amount of bleeding (spotting) from the vagina is relatively common in pregnancy. It usually stops on its own. Various things can cause bleeding or spotting in pregnancy. Some bleeding may be related to the pregnancy, and some may not. Sometimes the bleeding is normal and is not a problem. However, bleeding can also be a sign of something serious. Be sure to tell your health care provider about any vaginal bleeding right away. °Some possible causes of vaginal bleeding during the second trimester include: °· Infection, inflammation, or growths on the cervix. °· The placenta may be partially or completely covering the opening of the cervix inside the uterus (placenta previa). °· The placenta may have separated from the uterus (abruption of the placenta). °· You may be having early (preterm) labor. °· The cervix may not be strong enough to keep a baby inside the uterus (cervical insufficiency). °· Tiny cysts may have developed in the uterus instead of pregnancy tissue (molar pregnancy). ° °Follow these instructions at home: °Watch your condition for any changes. The following actions may help to lessen any discomfort you are feeling: °· Follow your health care provider's instructions for limiting your activity. If your health care provider orders bed rest, you may need to stay in bed and only get up to use the bathroom. However, your health care provider may allow you to continue light activity. °· If needed, make plans for someone to help with your regular activities and responsibilities while you are on bed rest. °· Keep track of the number of pads you use each day, how often you change pads, and how soaked (saturated) they are. Write this down. °· Do not use tampons. Do not douche. °· Do not have sexual intercourse or orgasms until approved by your health care provider. °· If you pass any tissue from your vagina, save the tissue so you can show it to your  health care provider. °· Only take over-the-counter or prescription medicines as directed by your health care provider. °· Do not take aspirin because it can make you bleed. °· Do not exercise or perform any strenuous activities or heavy lifting without your health care provider's permission. °· Keep all follow-up appointments as directed by your health care provider. ° °Contact a health care provider if: °· You have any vaginal bleeding during any part of your pregnancy. °· You have cramps or labor pains. °· You have a fever, not controlled by medicine. °Get help right away if: °· You have severe cramps in your back or belly (abdomen). °· You have contractions. °· You have chills. °· You pass large clots or tissue from your vagina. °· Your bleeding increases. °· You feel light-headed or weak, or you have fainting episodes. °· You are leaking fluid or have a gush of fluid from your vagina. °This information is not intended to replace advice given to you by your health care provider. Make sure you discuss any questions you have with your health care provider. °Document Released: 10/28/2004 Document Revised: 06/26/2015 Document Reviewed: 09/25/2012 °Elsevier Interactive Patient Education © 2018 [employer] ° °

## 2017-05-05 LAB — GC/CHLAMYDIA PROBE AMP (~~LOC~~) NOT AT ARMC
Chlamydia: NEGATIVE
Neisseria Gonorrhea: NEGATIVE

## 2017-05-05 LAB — ABO/RH: ABO/RH(D): A POS

## 2017-05-07 ENCOUNTER — Other Ambulatory Visit: Payer: Self-pay

## 2017-05-07 ENCOUNTER — Emergency Department
Admission: EM | Admit: 2017-05-07 | Discharge: 2017-05-07 | Disposition: A | Discharge status: Home / Self Care | Payer: BLUE CROSS/BLUE SHIELD | Attending: Emergency Medicine | Admitting: Emergency Medicine

## 2017-05-07 ENCOUNTER — Encounter: Payer: Self-pay | Admitting: Emergency Medicine

## 2017-05-07 DIAGNOSIS — Z3A15 15 weeks gestation of pregnancy: Secondary | ICD-10-CM | POA: Insufficient documentation

## 2017-05-07 DIAGNOSIS — O468X2 Other antepartum hemorrhage, second trimester: Secondary | ICD-10-CM | POA: Diagnosis not present

## 2017-05-07 DIAGNOSIS — O4690 Antepartum hemorrhage, unspecified, unspecified trimester: Secondary | ICD-10-CM

## 2017-05-07 DIAGNOSIS — O4692 Antepartum hemorrhage, unspecified, second trimester: Secondary | ICD-10-CM | POA: Diagnosis present

## 2017-05-07 LAB — CBC
HEMATOCRIT: 29 % — AB (ref 35.0–47.0)
Hemoglobin: 9.8 g/dL — ABNORMAL LOW (ref 12.0–16.0)
MCH: 30.6 pg (ref 26.0–34.0)
MCHC: 33.9 g/dL (ref 32.0–36.0)
MCV: 90.2 fL (ref 80.0–100.0)
Platelets: 237 10*3/uL (ref 150–440)
RBC: 3.21 MIL/uL — ABNORMAL LOW (ref 3.80–5.20)
RDW: 13.2 % (ref 11.5–14.5)
WBC: 11.5 10*3/uL — ABNORMAL HIGH (ref 3.6–11.0)

## 2017-05-07 LAB — HCG, QUANTITATIVE, PREGNANCY: HCG, BETA CHAIN, QUANT, S: 31489 m[IU]/mL — AB (ref <5)

## 2017-05-07 NOTE — ED Triage Notes (Signed)
Pt to triage via EMS from home, report vag bleeding since Wednesday, dx with subchorionic bleeding, denies pain.  Hypotensive en route, NS started by EMS.  Pt G1P0.  Pt reports mild bleeding yesterday but heavy today

## 2017-05-07 NOTE — Consult Note (Signed)
Obstetrics & Gynecology History and Physical Note  Date of Consultation: 05/07/2017   Requesting Provider: [facility]  Primary OBGYN: Hoover Browns, MD Primary Care Provider: Sharilyn Sites  Reason for Consultation: 15 weeks, vaginal bleeding  History of Present Illness: Melissa Wilkins is a 37 y.o. G1P0 (Patient's last menstrual period was 07/23/2016.), with the above CC. She is here after second episode of bleeding.  Brown d/c noted 2 weeks ago, off and on.  Then a few days ago had bright red blood.  [place] and exam the, [facility], no cervical dilation or funneling noted.  Been well, then bled today. Some activity, no sex activity.  No pain, ROM.  No prior cervical procedures, D&C, other.  ROS: A review of systems was performed and was complete and comprehensive, except as stated in the above HPI.  OBGYN History: As per HPI. OB History    Gravida  1   Para      Term      Preterm      AB      Living        SAB      TAB      Ectopic      Multiple      Live Births             Past Medical History: History reviewed. No pertinent past medical history.  Past Surgical History: Past Surgical History:  Procedure Laterality Date  . NO PAST SURGERIES      Family History:  Family History  Family history unknown: Yes   She denies any female cancers, bleeding or blood clotting disorders.   Social History:  Social History   Socioeconomic History  . Marital status: Single    Spouse name: Not on file  . Number of children: Not on file  . Years of education: Not on file  . Highest education level: Not on file  Occupational History  . Not on file  Social Needs  . Financial resource strain: Not on file  . Food insecurity:    Worry: Not on file    Inability: Not on file  . Transportation needs:    Medical: Not on file    Non-medical: Not on file  Tobacco Use  . Smoking status: Never Smoker  . Smokeless tobacco: Never Used  Substance and Sexual Activity  . Alcohol use: Yes    Comment: social drinker  . Drug use: No  . Sexual activity: Yes    Birth control/protection: None  Lifestyle  . Physical activity:    Days per week: Not on file    Minutes per session: Not on file  . Stress: Not on file  Relationships  . Social connections:    Talks on phone: Not on file    Gets together: Not on file    Attends religious service: Not on file    Active member of club or organization: Not on file    Attends meetings of clubs or organizations: Not on file    Relationship status: Not on file  . Intimate partner violence:    Fear of current or ex partner: Not on file    Emotionally abused: Not on file    Physically abused: Not on file    Forced sexual activity: Not on file  Other Topics Concern  . Not on file  Social History Narrative  . Not on file    Allergy: No Known Allergies  Current Outpatient Medications:  (Not in a hospital  admission)  Hospital Medications: No current facility-administered medications for this encounter.    Current Outpatient Medications  Medication Sig Dispense Refill  . ondansetron (ZOFRAN ODT) 4 MG disintegrating tablet Take 1 tablet (4 mg total) by mouth every 8 (eight) hours as needed for nausea. 10 tablet 0    Physical Exam: Vitals:   05/07/17 1524 05/07/17 1525 05/07/17 1630  BP: (!) 88/56  107/73  Pulse: 75  76  Resp: 16    Temp: 98.4 F (36.9 C)    TempSrc: Oral    SpO2: 100%  100%  Weight:  170 lb (77.1 kg)   Height:  5\' 5"  (1.651 m)     Temp:  [98.4 F (36.9 C)] 98.4 F (36.9 C) (04/06 1524) Pulse Rate:  [75-76] 76 (04/06 1630) Resp:  [16] 16 (04/06 1524) BP: (88-107)/(56-73) 107/73 (04/06 1630) SpO2:  [100 %] 100 % (04/06 1630) Weight:  [170 lb (77.1 kg)] 170 lb (77.1 kg) (04/06 1525) No intake/output data recorded. No intake/output data recorded. No intake or output data in the 24 hours ending 05/07/17 1814  Body mass index is 28.29 kg/m. Constitutional: Well nourished, well developed female in  no acute distress.  HEENT: normal Neck:  Supple, normal appearance, and no thyromegaly  Cardiovascular:Regular rate and rhythm.  No murmurs, rubs or gallops. Respiratory:  Clear to auscultation bilateral. Normal respiratory effort Abdomen: positive bowel sounds and no masses, hernias; diffusely non tender to palpation, non distended Neuro: grossly intact Psych:  Normal mood and affect.  Skin:  Warm and dry.  MS: normal gait and normal bilateral lower extremity strength/ROM/symmetry Lymphatic:  No inguinal lymphadenopathy.   Pelvic exam: is not limited by body habitus EGBUS: within normal limits Vagina: blood noted in vagina; no lesions. Bladder and Urethra: normal. Cervix: not dilated, thick Uterus:  enlarged, 15 weeks size Adnexa: not evaluated FHT 140s  Recent Labs  Lab 05/04/17 2139 05/07/17 1609  WBC 10.7* 11.5*  HGB 11.3* 9.8*  HCT 31.8* 29.0*  PLT 240 237   Assessment: Melissa Wilkins is a 37 y.o. G1P0 (Patient's last menstrual period was 07/23/2016.) who presented to the ED with complaints of 15 week vaginal bleeding, [facility]; findings are consistent with same.  Plan: No s/sx cervical incompetence Fetal well being reassuring Pelvic rest and modified activity rest advised Follow up Monday with OB provider [place]  Annamarie MajorPaul Harris, MD, Merlinda FrederickFACOG [facility], [facility] 05/07/2017  6:14 PM Pager [phone]

## 2017-05-07 NOTE — ED Notes (Signed)
Discussed discharge instructions and return precautions, pt instructed to follow up at OB-GYN office on Monday, pt verbalized understanding.

## 2017-05-07 NOTE — ED Provider Notes (Signed)
[facility] Emergency Department Provider Note  ____________________________________________  Time seen: Approximately 3:53 PM  I have reviewed the triage vital signs and the nursing notes.   HISTORY  Chief Complaint Vaginal Bleeding    HPI Melissa Wilkins is a 37 y.o. female who complains of heavy vaginal bleeding while at the mall today. She noted that she was having bleeding, and she went to the bathroom and found that blood had soaked through her pad and clothing as well as a pool of blood being absorbed on the pad. When she removed it, she passed several large blood clots in the toilet as well. She then had lightheadedness and nearly fell but denies loss of consciousness or head injury. EMS found her blood pressure to be initially low, about 80 systolic, started IV saline. Patient states that she now feels better and feels like the bleeding has improved.  She is G1 P0, [redacted] weeks pregnant, prenatal care in [place]. She had an episode of heavy bleeding 3 days ago and was diagnosed with subchorionic hemorrhage at that time. The bleeding had subsided and she was discharged home      History reviewed. No pertinent past medical history.   There are no active problems to display for this patient.    Past Surgical History:  Procedure Laterality Date  . NO PAST SURGERIES       Prior to Admission medications   Medication Sig Start Date End Date Taking? Authorizing Provider  ondansetron (ZOFRAN ODT) 4 MG disintegrating tablet Take 1 tablet (4 mg total) by mouth every 8 (eight) hours as needed for nausea. 06/05/15   Mady GemmaWestfall, Elizabeth C, PA-C  prenatal vitamins   Allergies Patient has no known allergies.   Family History  Family history unknown: Yes    Social History Social History   Tobacco Use  . Smoking status: Never Smoker  . Smokeless tobacco: Never Used  Substance Use Topics  . Alcohol use: Yes    Comment: social drinker  . Drug  use: No    Review of Systems  Constitutional:   No fever or chills.   Cardiovascular:   No chest pain or syncope. Respiratory:   No dyspnea or cough. Gastrointestinal:   Negative for abdominal pain, vomiting and diarrhea.  Musculoskeletal:   Negative for focal pain or swelling All other systems reviewed and are negative except as documented above in ROS and HPI.  ____________________________________________   PHYSICAL EXAM:  VITAL SIGNS: ED Triage Vitals  Enc Vitals Group     BP 05/07/17 1524 (!) 88/56     Pulse Rate 05/07/17 1524 75     Resp 05/07/17 1524 16     Temp 05/07/17 1524 98.4 F (36.9 C)     Temp Source 05/07/17 1524 Oral     SpO2 05/07/17 1524 100 %     Weight 05/07/17 1525 170 lb (77.1 kg)     Height 05/07/17 1525 5\' 5"  (1.651 m)     Head Circumference --      Peak Flow --      Pain Score 05/07/17 1525 0     Pain Loc --      Pain Edu? --      Excl. in GC? --     Vital signs reviewed, nursing assessments reviewed. blood pressure 110/60 on my exam  Constitutional:   Alert and oriented. Well appearing and in no distress. Eyes:   mild conjunctival pallor. EOMI. PERRL. ENT  Head:   Normocephalic and atraumatic.      Nose:   No congestion/rhinnorhea.       Mouth/Throat:   MMM, no pharyngeal erythema. No peritonsillar mass.       Neck:   No meningismus. Full ROM. Hematological/Lymphatic/Immunilogical:   No cervical lymphadenopathy. Cardiovascular:   RRR. Symmetric bilateral radial and DP pulses.  No murmurs.  Respiratory:   Normal respiratory effort without tachypnea/retractions. Breath sounds are clear and equal bilaterally. No wheezes/rales/rhonchi. Gastrointestinal:   Soft and nontender. gravid, size consistent with dates. Non distended. There is no CVA tenderness.  No rebound, rigidity, or guarding. bedside ultrasound performed by me as part of physical exam, reveals a live IUP, heart rate approximately 140 bpm, normal gross motor movements. No  obvious subchorionic hemorrhage or fluid collection. Genitourinary:   pelvic exam performed with nurse Morrie Sheldon at bedside. External exam unremarkable except for some clotted blood on the perineum. Sterile speculum exam performed with a large amount of fresh blood in the vault cleared away with Fox swabs. There was residual clot at the cervix.  there is ongoing bleeding and reaccumulation in the vault. Bimanual exam reveals no tenderness, but external cervical os is about 1 cm dilated soft. Musculoskeletal:   Normal range of motion in all extremities. No joint effusions.  No lower extremity tenderness.  No edema. Neurologic:   Normal speech and language.  Motor grossly intact. No acute focal neurologic deficits are appreciated.  Skin:    Skin is warm, dry and intact. No rash noted.  No petechiae, purpura, or bullae.  ____________________________________________    LABS (pertinent positives/negatives) (all labs ordered are listed, but only abnormal results are displayed) Labs Reviewed  HCG, QUANTITATIVE, PREGNANCY - Abnormal; Notable for the following components:      Result Value   hCG, Beta Chain, Quant, S 31,489 (*)    All other components within normal limits  CBC - Abnormal; Notable for the following components:   WBC 11.5 (*)    RBC 3.21 (*)    Hemoglobin 9.8 (*)    HCT 29.0 (*)    All other components within normal limits   ____________________________________________   EKG    ____________________________________________    RADIOLOGY  No results found.  ____________________________________________   PROCEDURES Procedures  ____________________________________________  DIFFERENTIAL DIAGNOSIS   subchorionic hemorrhage, spontaneous abortion  CLINICAL IMPRESSION / ASSESSMENT AND PLAN / ED COURSE  Pertinent labs & imaging results that were available during my care of the patient were reviewed by me and considered in my medical decision making (see chart for  details).      Clinical Course as of May 07 1816  Sat May 07, 2017  1552 Blood type A+ as of 3 days ago at [facility] hospital.   [PS]  1552 patient well-appearing, blood pressure improved with IV fluids to 110/60. Abdominal exam unremarkable, bedside ultrasound shows live IUP without a obvious subchorionic hemorrhage. perform speculum exam and discuss with OB   [PS]  1640 Slight decrease in Hb from previous, 11 > 10.  Hemoglobin(!): 9.8 [PS]  1703 Pelvic shows ongoing bleeding, external cervical os open 1 cm, ?maybe unchanged from 4/3 or indicative of impending miscarriage   [PS]  1711 D/w OB Dr. Tiburcio Pea. Will review prior imaging to provide management recommendations or consider whether cerclage is an option.    [PS]  1813 D/w Dr. Tiburcio Pea - not a candidate for cerclage, no inteventions at this time. Suitable for DC home, f/u with her OB in  2 days (Monday). I counseled pt on possibility of spontaneous abortion given recurrent vaginal bleeding. Work note provided.    [PS]    Clinical Course User Index [PS] Sharman Cheek, MD     ----------------------------------------- 6:18 PM on 05/07/2017 -----------------------------------------  On imaging review, no evidence of previa. ____________________________________________   FINAL CLINICAL IMPRESSION(S) / ED DIAGNOSES    Final diagnoses:  Vaginal bleeding in pregnancy     ED Discharge Orders    None      Portions of this note were generated with dragon dictation software. Dictation errors may occur despite best attempts at proofreading.    Sharman Cheek, MD 05/07/17 [phone]

## 2017-05-07 NOTE — ED Notes (Signed)
IV removed from right hand that was placed by EMS

## 2017-06-02 ENCOUNTER — Encounter (HOSPITAL_COMMUNITY): Payer: Self-pay

## 2017-06-02 ENCOUNTER — Encounter (HOSPITAL_COMMUNITY): Payer: Self-pay | Admitting: *Deleted

## 2017-06-02 ENCOUNTER — Inpatient Hospital Stay (HOSPITAL_COMMUNITY)
Admission: AD | Admit: 2017-06-02 | Discharge: 2017-06-02 | Disposition: A | Discharge status: Home / Self Care | Payer: BLUE CROSS/BLUE SHIELD | Source: Ambulatory Visit | Attending: Obstetrics and Gynecology | Admitting: Obstetrics and Gynecology

## 2017-06-02 DIAGNOSIS — O42919 Preterm premature rupture of membranes, unspecified as to length of time between rupture and onset of labor, unspecified trimester: Secondary | ICD-10-CM

## 2017-06-02 DIAGNOSIS — O4102X0 Oligohydramnios, second trimester, not applicable or unspecified: Secondary | ICD-10-CM | POA: Insufficient documentation

## 2017-06-02 DIAGNOSIS — Z3A20 20 weeks gestation of pregnancy: Secondary | ICD-10-CM | POA: Insufficient documentation

## 2017-06-02 DIAGNOSIS — N898 Other specified noninflammatory disorders of vagina: Secondary | ICD-10-CM | POA: Diagnosis present

## 2017-06-02 LAB — AMNISURE RUPTURE OF MEMBRANE (ROM) NOT AT ARMC: AMNISURE: NEGATIVE

## 2017-06-02 LAB — GROUP B STREP BY PCR: GROUP B STREP BY PCR: NEGATIVE

## 2017-06-02 NOTE — MAU Note (Signed)
Sent from office.Pt reports she has been having"leaking" since March. AFI in office was low.

## 2017-06-02 NOTE — MAU Note (Signed)
Urine sent to lab 

## 2017-06-02 NOTE — Discharge Instructions (Signed)
Oligohydramnios Oligohydramnios is a conditionin which there is not enough fluid in the sac (amniotic sac) that surrounds your unborn baby (fetus). The amniotic sac in the uterus contains fluid (amniotic fluid) that:  Protects your baby from injury (trauma) and infections.  Helps your baby move freely inside the uterus.  Helps your baby's lungs, kidneys, and digestive system to develop.  This condition occurs before birth (is a prenatal condition), and it can interfere with your baby's normal prenatal growth and development. Oligohydramnios can happen any time during pregnancy, and it most commonly occurs during the last 3 months (third trimester). It is most likely to cause serious complications when it occurs early in pregnancy. Some possible complications of this condition include:  Early (premature) birth.  Birth defects.  Limited (restricted) fetal growth.  Decreased oxygen flow to the fetus due to pressure on the umbilical cord.  Pregnancy loss (miscarriage).  Stillbirth.  What are the causes? This condition may be caused by:  A leak or tear in the amniotic sac.  A problem with the organ that nourishes the baby in the uterus (placenta), such as failure of the placenta to provide enough blood, fluid, and nutrients to the baby.  Having identical twins who share the same placenta.  A fetal birth defect. This is usually an abnormality in the fetal kidneys or urinary tract.  A pregnancy that goes past the due date.  A condition that the mother has, such as: ? A lack of fluids in the body (dehydration). ? High blood pressure. ? Diabetes. ? Reactions to certain medicines, such as ibuprofen or blood pressure medicines (ACE inhibitors). ? Systemic lupus.  In some cases, the cause is unknown. What increases the risk? This condition is more likely to develop if you:  Become dehydrated.  Have high blood pressure.  Take NSAIDs or ACE inhibitors.  Have diabetes or  lupus.  Have poor prenatal care.  What are the signs or symptoms? In most cases, there are no symptoms of oligohydramnios. If you do have symptoms, they may include:  Fluid leaking from the vagina.  Having a uterus that is smaller than normal.  Feeling less movement of your baby in your uterus.  How is this diagnosed? This condition may be diagnosed by measuring the amount of amniotic fluid in your amniotic sac using the amniotic fluid index (AFI). The AFI is a prenatal ultrasound test that uses painless, harmless sound waves to create an image of your uterus and your baby. An AFI prenatal ultrasound test may be done:  To measure the amniotic fluid level.  To check your baby's kidneys and your baby's growth.  To evaluate the placenta.  You may also have other tests to find the cause of oligohydramnios. How is this treated? Treatment for this condition depends on how low your amniotic fluid level is, how far along you are in your pregnancy, and your overall health. Treatment may include:  Having your health care provider monitor your condition more closely than usual. You may have more frequent appointments and more AFI ultrasound measurements.  Increasing the amount of fluid in your body. This may be done by having you drink more fluids, or by giving you fluids through an IV tube that is inserted into one of your veins.  Injecting fluid into your amniotic sac during delivery (amnioinfusion).  Having your baby delivered early, if you are close to your due date.  Follow these instructions at home: Lifestyle  Do not drink alcohol. No safe  level of alcohol consumption during pregnancy has been determined. °· Do not use any tobacco products, such as cigarettes, chewing tobacco, and e-cigarettes. If you need help quitting, ask your health care provider. °· Do not use any illegal drugs. These can harm your developing baby or cause a miscarriage. °General instructions °· Take  over-the-counter and prescription medicines only as told by your health care provider. °· Follow instructions from your health care provider about physical activity and rest. Your health care provider may recommend that you stay in bed (be on bed rest). °· Follow instructions from your health care provider about eating or drinking restrictions. °· Eat healthy foods. °· Drink enough fluids to keep your urine clear or pale yellow. °· Keep all prenatal care appointments with your health care provider. This is important. °Contact a health care provider if: °· You notice that your baby seems to be moving less than usual. °Get help right away if: °· You have fluid leaking from your vagina. °· You start to have labor pains (contractions). This may feel like a sense of tightening in your lower abdomen. °· You have a fever. °This information is not intended to replace advice given to you by your health care provider. Make sure you discuss any questions you have with your health care provider. °Document Released: 05/05/2010 Document Revised: 06/23/2015 Document Reviewed: 07/31/2014 °Elsevier Interactive Patient Education © 2018 [employer] ° °

## 2017-06-02 NOTE — MAU Provider Note (Signed)
  History     CSN: [id number]  Arrival date and time: 06/02/17 1524   None     Chief Complaint  Patient presents with  . Vaginal Discharge   HPI pt sent over for evaluation.  [place] sig for oligohydraminios.  Pt reports questionable leaking of fluid since March.  No fever or chills.    Pertinent Gynecological History: Menses: .   Past Medical History:  Diagnosis Date  . Medical history non-contributory     Past Surgical History:  Procedure Laterality Date  . NO PAST SURGERIES      Family History  Family history unknown: Yes    Social History   Tobacco Use  . Smoking status: Never Smoker  . Smokeless tobacco: Never Used  Substance Use Topics  . Alcohol use: Yes    Comment: social drinker  . Drug use: No    Allergies: No Known Allergies  Medications Prior to Admission  Medication Sig Dispense Refill Last Dose  . Prenatal Vit-Fe Fumarate-FA (PRENATAL MULTIVITAMIN) TABS tablet Take 1 tablet by mouth daily at 12 noon.   06/02/2017 at Unknown time    Review of Systems Physical Exam   Blood pressure 117/69, pulse 88, temperature 98.9 F (37.2 C), temperature source Oral, resp. rate 18, height  (1.651 m), weight 78.5 kg (173 lb), last menstrual period 07/23/2016.  Physical Exam  Physical Examination: General appearance - alert, well appearing, and in no distress Chest - clear to auscultation, no wheezes, rales or rhonchi, symmetric air entry Heart - normal rate, regular rhythm, normal S1, S2, no murmurs, rubs, clicks or gallops, normal rate and regular rhythm Abdomen - soft, nontender, nondistended, no masses or organomegaly Pelvic - normal external genitalia, vulva, vagina, cervix, uterus and adnexa.  Clear fluis seen at introitus but no pooling Extremities - Homan's sign negative bilaterally  MAU Course  Procedures  MDM amnisure neg  Assessment and Plan  oligohyframnios Possible PPROM  amnisure neg DC home to monitor temperature and follow  closely Will do expectant management for now and FU with MFM for second opinon FU in one week  Makaylia Hewett A 06/02/2017, 6:42 PM

## 2017-06-03 ENCOUNTER — Other Ambulatory Visit (HOSPITAL_COMMUNITY): Payer: Self-pay | Admitting: Obstetrics and Gynecology

## 2017-06-03 ENCOUNTER — Encounter (HOSPITAL_COMMUNITY): Payer: Self-pay

## 2017-06-03 DIAGNOSIS — Z3689 Encounter for other specified antenatal screening: Secondary | ICD-10-CM

## 2017-06-07 ENCOUNTER — Other Ambulatory Visit (HOSPITAL_COMMUNITY): Payer: Self-pay | Admitting: Obstetrics and Gynecology

## 2017-06-07 ENCOUNTER — Encounter (HOSPITAL_COMMUNITY): Payer: Self-pay

## 2017-06-07 ENCOUNTER — Ambulatory Visit (HOSPITAL_COMMUNITY)
Admission: RE | Admit: 2017-06-07 | Discharge: 2017-06-07 | Disposition: A | Discharge status: Home / Self Care | Payer: BLUE CROSS/BLUE SHIELD | Source: Ambulatory Visit | Attending: Obstetrics and Gynecology | Admitting: Obstetrics and Gynecology

## 2017-06-07 ENCOUNTER — Other Ambulatory Visit (HOSPITAL_COMMUNITY): Payer: Self-pay | Admitting: *Deleted

## 2017-06-07 ENCOUNTER — Ambulatory Visit (HOSPITAL_COMMUNITY)
Admission: RE | Admit: 2017-06-07 | Discharge: 2017-06-07 | Disposition: A | Discharge status: Home / Self Care | Payer: BLUE CROSS/BLUE SHIELD | Source: Ambulatory Visit | Attending: Family Medicine | Admitting: Family Medicine

## 2017-06-07 DIAGNOSIS — D569 Thalassemia, unspecified: Secondary | ICD-10-CM

## 2017-06-07 DIAGNOSIS — O98812 Other maternal infectious and parasitic diseases complicating pregnancy, second trimester: Secondary | ICD-10-CM | POA: Diagnosis not present

## 2017-06-07 DIAGNOSIS — O4100X0 Oligohydramnios, unspecified trimester, not applicable or unspecified: Secondary | ICD-10-CM

## 2017-06-07 DIAGNOSIS — O4102X0 Oligohydramnios, second trimester, not applicable or unspecified: Secondary | ICD-10-CM | POA: Insufficient documentation

## 2017-06-07 DIAGNOSIS — O365920 Maternal care for other known or suspected poor fetal growth, second trimester, not applicable or unspecified: Secondary | ICD-10-CM

## 2017-06-07 DIAGNOSIS — Z3A19 19 weeks gestation of pregnancy: Secondary | ICD-10-CM

## 2017-06-07 DIAGNOSIS — A599 Trichomoniasis, unspecified: Secondary | ICD-10-CM | POA: Diagnosis not present

## 2017-06-07 DIAGNOSIS — O99012 Anemia complicating pregnancy, second trimester: Secondary | ICD-10-CM

## 2017-06-07 DIAGNOSIS — Z363 Encounter for antenatal screening for malformations: Secondary | ICD-10-CM | POA: Insufficient documentation

## 2017-06-07 DIAGNOSIS — Z3689 Encounter for other specified antenatal screening: Secondary | ICD-10-CM

## 2017-06-07 DIAGNOSIS — O468X2 Other antepartum hemorrhage, second trimester: Secondary | ICD-10-CM

## 2017-06-07 DIAGNOSIS — Z8489 Family history of other specified conditions: Secondary | ICD-10-CM | POA: Insufficient documentation

## 2017-06-07 DIAGNOSIS — O4592 Premature separation of placenta, unspecified, second trimester: Secondary | ICD-10-CM | POA: Diagnosis not present

## 2017-06-07 DIAGNOSIS — O09512 Supervision of elderly primigravida, second trimester: Secondary | ICD-10-CM | POA: Diagnosis present

## 2017-06-07 DIAGNOSIS — O418X20 Other specified disorders of amniotic fluid and membranes, second trimester, not applicable or unspecified: Secondary | ICD-10-CM

## 2017-06-07 HISTORY — DX: Unspecified abnormal cytological findings in specimens from vagina: R87.629

## 2017-06-07 HISTORY — DX: Trichomonal vulvovaginitis: A59.01

## 2017-06-07 NOTE — ED Notes (Signed)
Pt reports leaking fluid since March, had bleeding the first of April, the leaking continued and decreased over this past weekend.  Very minimal leaking today.

## 2017-06-07 NOTE — Consult Note (Signed)
Maternal Fetal Medicine Consultation  Requesting Provider(s): Melissa Wilkins Primary OB: Melissa Wilkins Reason for consultation: oligohydramnios  HPI: 37yo P0 at 19+6 weeks with severe oligohydramnios on office [place] 06/02/2017, referred for evaluation. She has reported leakage of fluid since late March, and has had 2 episodes of significant bleeding with subchorionic hemorrhages noted. On a limited US on 4/3 the [facility] was noted and fluid was subjectively normal. She has had a low risk cell free DNA test. She had a negative amnisure on 06/02/2017. Scan today in MFM showed near-anhydramnios with normal kidneys, urine in the bladder, and biometry showed BPD, HC, FL all <3rd percentile. AC and cerebellum were 19-23rd percentile OB History: OB History    Gravida  1   Para      Term      Preterm      AB      Living  0     SAB      TAB      Ectopic      Multiple      Live Births              PMH:  Past Medical History:  Diagnosis Date  . Medical history non-contributory   . Trichomonas vaginitis   . Vaginal Pap smear, abnormal     PSH:  Past Surgical History:  Procedure Laterality Date  . NO PAST SURGERIES     Meds: PNV Allergies: NKDA FH: See EPIC section Soc: See EPIC section  Review of Systems: no vaginal bleeding  or cramping/contractions, no LOF, no nausea/vomiting. All other systems reviewed and are negative.  PE:  VS: BP 137/77  Pulse 89  Weight 172.4 lb GEN: well-appearing female ABD: gravid, NT  Please see separate document for fetal ultrasound report.  A/P: severe early onset oligohydramnios The differential diagnosis for her oligohydramnios is simplified by the normal anatomy, so renal dysplasia/agenesis is not the cause. This leaves either placental dysfunction or PPROM as our most likely candidates. I am concerned about the low biometric measurements. We discussed the possibility of placental dysfunction causing the low fluid and the low growth. This is unfortunately not  a condition we have any viable interventions to recommend. Given her history, I feel PPROM is the most likely scenario. I discussed this with the patient, also pointing out there is the possibility that membranes are intact and placental dysfunction is the culprit. For now we will proceed as PPROM. We discussed fetal contractures and movement disorders and the unpredictable risk of pulmonary hypoplasia with early-onset oligohydramnios. She understands that the pulmonary hypoplasia cannot be adequately predicted antenatally. She understands the standard recommendations for admission between 23 and 24 weeks, 1 weeks course of latency antibiotics, and delivery at 34 weeks. She understands that if signs of intra-amniotic infection or active labor is seen, then she will require delivery no matter the gestational age.  We discussed the reasons behind these interventions as well. I took the liberty of starting her on ampicillin  QID x 1 week and Azithromycin  QD x 3 days. She will return in 3 weeks for repeat evaluation. I also recommend weekly [place] evaluations until then. These can be done in your office or at [facility]; If you wish them to be done at [facility] please have your office make those appointments She understands that she needs to remain on pelvic rest, but that bedrest is unnecessary and possibly counterproductive  Thank you for the opportunity to be a part of the care of Melissa Wilkins  [name]. Please contact our office if we can be of further assistance.   I spent approximately 30 minutes with this patient with over 50% of time spent in face-to-face counseling.

## 2017-06-10 ENCOUNTER — Inpatient Hospital Stay (HOSPITAL_COMMUNITY)
Admission: AD | Admit: 2017-06-10 | Discharge: 2017-06-11 | DRG: 805 | Disposition: A | Discharge status: Home / Self Care | Payer: BLUE CROSS/BLUE SHIELD | Source: Ambulatory Visit | Attending: Obstetrics & Gynecology | Admitting: Obstetrics & Gynecology

## 2017-06-10 ENCOUNTER — Encounter (HOSPITAL_COMMUNITY): Payer: Self-pay

## 2017-06-10 DIAGNOSIS — Z3A20 20 weeks gestation of pregnancy: Secondary | ICD-10-CM | POA: Diagnosis not present

## 2017-06-10 DIAGNOSIS — O42912 Preterm premature rupture of membranes, unspecified as to length of time between rupture and onset of labor, second trimester: Principal | ICD-10-CM | POA: Diagnosis present

## 2017-06-10 DIAGNOSIS — O365920 Maternal care for other known or suspected poor fetal growth, second trimester, not applicable or unspecified: Secondary | ICD-10-CM | POA: Diagnosis present

## 2017-06-10 DIAGNOSIS — O364XX0 Maternal care for intrauterine death, not applicable or unspecified: Secondary | ICD-10-CM | POA: Diagnosis present

## 2017-06-10 LAB — CBC WITH DIFFERENTIAL/PLATELET
Basophils Absolute: 0 10*3/uL (ref 0.0–0.1)
Basophils Relative: 0 %
EOS ABS: 0.1 10*3/uL (ref 0.0–0.7)
Eosinophils Relative: 1 %
HEMATOCRIT: 33.5 % — AB (ref 36.0–46.0)
HEMOGLOBIN: 11.3 g/dL — AB (ref 12.0–15.0)
LYMPHS ABS: 2.5 10*3/uL (ref 0.7–4.0)
Lymphocytes Relative: 26 %
MCH: 31.1 pg (ref 26.0–34.0)
MCHC: 33.7 g/dL (ref 30.0–36.0)
MCV: 92.3 fL (ref 78.0–100.0)
MONOS PCT: 4 %
Monocytes Absolute: 0.4 10*3/uL (ref 0.1–1.0)
NEUTROS ABS: 6.9 10*3/uL (ref 1.7–7.7)
NEUTROS PCT: 69 %
Platelets: 281 10*3/uL (ref 150–400)
RBC: 3.63 MIL/uL — AB (ref 3.87–5.11)
RDW: 13.8 % (ref 11.5–15.5)
WBC: 9.9 10*3/uL (ref 4.0–10.5)

## 2017-06-10 LAB — URINALYSIS, ROUTINE W REFLEX MICROSCOPIC
BILIRUBIN URINE: NEGATIVE
GLUCOSE, UA: NEGATIVE mg/dL
KETONES UR: NEGATIVE mg/dL
NITRITE: NEGATIVE
PH: 7 (ref 5.0–8.0)
PROTEIN: NEGATIVE mg/dL
Specific Gravity, Urine: 1.004 — ABNORMAL LOW (ref 1.005–1.030)

## 2017-06-10 LAB — WET PREP, GENITAL
CLUE CELLS WET PREP: NONE SEEN
Sperm: NONE SEEN
TRICH WET PREP: NONE SEEN
Yeast Wet Prep HPF POC: NONE SEEN

## 2017-06-10 LAB — COMPREHENSIVE METABOLIC PANEL
ALBUMIN: 4.1 g/dL (ref 3.5–5.0)
ALK PHOS: 68 U/L (ref 38–126)
ALT: 15 U/L (ref 14–54)
AST: 20 U/L (ref 15–41)
Anion gap: 12 (ref 5–15)
BILIRUBIN TOTAL: 0.8 mg/dL (ref 0.3–1.2)
BUN: 6 mg/dL (ref 6–20)
CALCIUM: 9.7 mg/dL (ref 8.9–10.3)
CO2: 22 mmol/L (ref 22–32)
Chloride: 103 mmol/L (ref 101–111)
Creatinine, Ser: 0.6 mg/dL (ref 0.44–1.00)
GFR calc Af Amer: >60 mL/min (ref >60)
GFR calc non Af Amer: >60 mL/min (ref >60)
GLUCOSE: 95 mg/dL (ref 65–99)
Potassium: 3.9 mmol/L (ref 3.5–5.1)
SODIUM: 137 mmol/L (ref 135–145)
TOTAL PROTEIN: 7.4 g/dL (ref 6.5–8.1)

## 2017-06-10 LAB — TYPE AND SCREEN
ABO/RH(D): A POS
Antibody Screen: NEGATIVE

## 2017-06-10 MED ORDER — MISOPROSTOL 200 MCG PO TABS
ORAL_TABLET | ORAL | Status: AC
  Filled 2017-06-10: qty 5

## 2017-06-10 MED ORDER — OXYTOCIN 40 UNITS IN LACTATED RINGERS INFUSION - SIMPLE MED
INTRAVENOUS | Status: AC
  Filled 2017-06-10: qty 1000

## 2017-06-10 MED ORDER — FENTANYL CITRATE (PF) 100 MCG/2ML IJ SOLN
100.0000 ug | INTRAMUSCULAR | Status: DC | PRN
  Administered 2017-06-10 (×2): 100 ug via INTRAVENOUS
  Filled 2017-06-10 (×2): qty 2

## 2017-06-10 MED ORDER — METHYLERGONOVINE MALEATE 0.2 MG/ML IJ SOLN
INTRAMUSCULAR | Status: AC
  Filled 2017-06-10: qty 1

## 2017-06-10 MED ORDER — SODIUM CHLORIDE 0.9 % IV SOLN
500.0000 mg | INTRAVENOUS | Status: DC
  Administered 2017-06-10: 500 mg via INTRAVENOUS
  Filled 2017-06-10: qty 500

## 2017-06-10 MED ORDER — CALCIUM CARBONATE ANTACID 500 MG PO CHEW: 2.0000 | CHEWABLE_TABLET | ORAL | Status: DC | PRN

## 2017-06-10 MED ORDER — LACTATED RINGERS IV SOLN: INTRAVENOUS | Status: DC

## 2017-06-10 MED ORDER — ZOLPIDEM TARTRATE 5 MG PO TABS: 5.0000 mg | ORAL_TABLET | Freq: Every evening | ORAL | Status: DC | PRN

## 2017-06-10 MED ORDER — AMOXICILLIN 500 MG PO CAPS: 500.0000 mg | ORAL_CAPSULE | Freq: Three times a day (TID) | ORAL | Status: DC

## 2017-06-10 MED ORDER — SODIUM CHLORIDE 0.9 % IV SOLN
2.0000 g | Freq: Four times a day (QID) | INTRAVENOUS | Status: DC
  Administered 2017-06-10 – 2017-06-11 (×2): 2 g via INTRAVENOUS
  Filled 2017-06-10 (×2): qty 2000
  Filled 2017-06-10 (×2): qty 2

## 2017-06-10 MED ORDER — ACETAMINOPHEN 325 MG PO TABS: 650.0000 mg | ORAL_TABLET | ORAL | Status: DC | PRN

## 2017-06-10 MED ORDER — PRENATAL MULTIVITAMIN CH: 1.0000 | ORAL_TABLET | Freq: Every day | ORAL | Status: DC

## 2017-06-10 MED ORDER — AZITHROMYCIN 250 MG PO TABS: 500.0000 mg | ORAL_TABLET | Freq: Every day | ORAL | Status: DC

## 2017-06-10 MED ORDER — DOCUSATE SODIUM 100 MG PO CAPS
100.0000 mg | ORAL_CAPSULE | Freq: Every day | ORAL | Status: DC
  Administered 2017-06-10: 100 mg via ORAL
  Filled 2017-06-10: qty 1

## 2017-06-10 MED ORDER — LIDOCAINE HCL (PF) 1 % IJ SOLN
INTRAMUSCULAR | Status: AC
  Filled 2017-06-10: qty 30

## 2017-06-10 MED ORDER — LACTATED RINGERS IV SOLN
INTRAVENOUS | Status: DC
  Administered 2017-06-10: 19:00:00 via INTRAVENOUS

## 2017-06-10 NOTE — Progress Notes (Addendum)
G1 @ 20.[redacted] wksga. Here dt scant bleeding and abd pain that started today. Doppler 163  seeing MFM dt rescent dx of low amniotic fluid and been on abx for it.   1736: Provider notified. Report given. Orders received to do wetprep and GC and provider said will come in.   wetprep and GC done.   1815: provider at bs assessing pt. Speculum exam done. Pt is dilated with BB.   1820: Ordered to admit to 3rd floor.   1823: 3rd floor notified. Report given. Room assignment pending.   1827: 3rd floor called to unit. Informed ptt reassigned to birthing per house coverage. Birthing charge made aware. Pt assigned to 161.   Pt to birthing via bed.

## 2017-06-10 NOTE — H&P (Signed)
Anterpartum History and Physical  Melissa Wilkins is 37 year old G1P0 at 20 weeks 2 days who presents to MAU with vaginal bleeding.  She has a history of PPPROM and diagnosis of anhydramnios by MFM.   Patient does note feeling  Abdominal cramping.   Pregnancy complicated by PPPROM Oligohydramnios / hydramnios IUGR / Fetal contractures  PMHX  Past Medical History:  Diagnosis Date  . Trichomonas vaginitis   . Vaginal Pap smear, abnormal    biopsy     PSURG HX  Past Surgical History:  Procedure Laterality Date  . NO PAST SURGERIES      FAM HX  Family History  Problem Relation Age of Onset  . Diabetes Maternal Aunt   . Hyperlipidemia Maternal Aunt   . Diabetes Maternal Grandmother   . Hyperlipidemia Maternal Grandmother     SOC HX  Social History   Socioeconomic History  . Marital status: Single    Spouse name: Not on file  . Number of children: Not on file  . Years of education: Not on file  . Highest education level: Not on file  Occupational History  . Not on file  Social Needs  . Financial resource strain: Not on file  . Food insecurity:    Worry: Not on file    Inability: Not on file  . Transportation needs:    Medical: Not on file    Non-medical: Not on file  Tobacco Use  . Smoking status: Never Smoker  . Smokeless tobacco: Never Used  Substance and Sexual Activity  . Alcohol use: Yes    Comment: social drinker  . Drug use: No  . Sexual activity: Yes    Birth control/protection: None  Lifestyle  . Physical activity:    Days per week: Not on file    Minutes per session: Not on file  . Stress: Not on file  Relationships  . Social connections:    Talks on phone: Not on file    Gets together: Not on file    Attends religious service: Not on file    Active member of club or organization: Not on file    Attends meetings of clubs or organizations: Not on file    Relationship status: Not on file  . Intimate partner violence:    Fear of  current or ex partner: Not on file    Emotionally abused: Not on file    Physically abused: Not on file    Forced sexual activity: Not on file  Other Topics Concern  . Not on file  Social History Narrative  . Not on file    ROS  Review of Systems - as per HPI  MFM [place] 06/07/17: SIUP all fetal measurements are <3%  LABS CBC    Component Value Date/Time   WBC 11.5 (H) 05/07/2017 1609   RBC 3.21 (L) 05/07/2017 1609   HGB 9.8 (L) 05/07/2017 1609   HCT 29.0 (L) 05/07/2017 1609   PLT 237 05/07/2017 1609   MCV 90.2 05/07/2017 1609   MCH 30.6 05/07/2017 1609   MCHC 33.9 05/07/2017 1609   RDW 13.2 05/07/2017 1609    Gen WDWN  IN NAD ABD Gravid soft NT Pelvic Exam:  Speculum: completely dilatated and bulging bag of membranes.    Assessment: SIUP at [redacted]w[redacted]d with PPPROM history, anhydramnios and now advanced dilation  Plan: Admit to antepartum Trendelenburg position Latency antibiotics CBC, CMP, Type and Screen.  Patient counseled at length that at this gestational  age the fetus is not viable.  She voiced understanding.   Sameen Leas STACIA

## 2017-06-10 NOTE — MAU Provider Note (Addendum)
Patient Melissa Wilkins is a 37 y.o.  G1P0 At [redacted]w[redacted]d here with complaints of abdominal pain and spotting. She denies abnormal discharge, dysuria, low back pain.   She has recently been diagnosed with anhydramnios and possible PPROM.  History     CSN: [id number]  Arrival date and time: 06/10/17 1620   First Provider Initiated Contact with Patient 06/10/17 1734      Chief Complaint  Patient presents with  . Vaginal Bleeding  . Abdominal Pain   Vaginal Bleeding  The patient's primary symptoms include vaginal bleeding. This is a new problem. The current episode started today. The problem occurs rarely. Associated symptoms include abdominal pain. Pertinent negatives include no constipation, diarrhea, urgency or vomiting. The vaginal discharge was bloody. The vaginal bleeding is spotting. She has not been passing clots.  Abdominal Pain  Chronicity: Abdominal pain started at 1:30 this morning after she took  her antibiotic. She fell back asleep and then woke up at 6 and it was still there.  Pertinent negatives include no constipation, diarrhea or vomiting.  She had a similar pain on Tuesday when she started her antibiotics. She says this pain is "a little worse" than that. 3/10. The last time that she had this pain was at 5:20pm.   OB History    Gravida  1   Para      Term      Preterm      AB      Living  0     SAB      TAB      Ectopic      Multiple      Live Births              Past Medical History:  Diagnosis Date  . Trichomonas vaginitis   . Vaginal Pap smear, abnormal    biopsy     Past Surgical History:  Procedure Laterality Date  . NO PAST SURGERIES      Family History  Problem Relation Age of Onset  . Diabetes Maternal Aunt   . Hyperlipidemia Maternal Aunt   . Diabetes Maternal Grandmother   . Hyperlipidemia Maternal Grandmother     Social History   Tobacco Use  . Smoking status: Never Smoker  . Smokeless tobacco: Never Used  Substance  Use Topics  . Alcohol use: Yes    Comment: social drinker  . Drug use: No    Allergies: No Known Allergies  Medications Prior to Admission  Medication Sig Dispense Refill Last Dose  . Prenatal Vit-Fe Fumarate-FA (PRENATAL MULTIVITAMIN) TABS tablet Take 1 tablet by mouth daily at 12 noon.   Taking    Review of Systems  Constitutional: Negative.   HENT: Negative.   Eyes: Negative.   Respiratory: Negative.   Gastrointestinal: Positive for abdominal pain. Negative for constipation, diarrhea and vomiting.  Genitourinary: Positive for vaginal bleeding. Negative for urgency.  Psychiatric/Behavioral: Negative.    Physical Exam   Blood pressure 123/76, pulse 85, temperature 99 F (37.2 C), temperature source Oral, resp. rate 18, height  (1.651 m), weight 174 lb 1.3 oz (79 kg), last menstrual period 01/19/2017.  Physical Exam  MAU Course  Procedures  MDM -Given patient's HR status, Dr. Mora Appl notified by RN and will come to evaluate the patient.   Assessment and Plan    Charlesetta Garibaldi [facility] 06/10/2017, 5:34 PM

## 2017-06-10 NOTE — Progress Notes (Deleted)
@   20.[redacted] wksga. Here due to scant bleeding and abdominal pain that started today. Doppler 163  Seeing MFM dt rescent dx of low amniotic fluid and been taking abx that she started on Tuesday.

## 2017-06-10 NOTE — Progress Notes (Signed)
Met with patient to answer questions and discuss the plan of care. Patient wanted to know if there were any medications we could give to help her remain pregnant until the baby reaches viability. Discussed that we are trying to postpone delivery by keeping her in trendelenburg but that we do not know when she will deliver. Patient aware her baby is not viable currently and would not be for several weeks. She verbalizes she will call the nurse if she's having pain or she has leaking fluid.  Melissa Wilkins 06/10/2017 7:59 PM

## 2017-06-11 ENCOUNTER — Encounter (HOSPITAL_COMMUNITY): Payer: Self-pay | Admitting: *Deleted

## 2017-06-11 DIAGNOSIS — O364XX0 Maternal care for intrauterine death, not applicable or unspecified: Secondary | ICD-10-CM

## 2017-06-11 LAB — CBC
HEMATOCRIT: 25.7 % — AB (ref 36.0–46.0)
HEMOGLOBIN: 8.8 g/dL — AB (ref 12.0–15.0)
MCH: 31.1 pg (ref 26.0–34.0)
MCHC: 33.9 g/dL (ref 30.0–36.0)
MCV: 91.8 fL (ref 78.0–100.0)
Platelets: 249 10*3/uL (ref 150–400)
RBC: 2.8 MIL/uL — ABNORMAL LOW (ref 3.87–5.11)
RDW: 13.8 % (ref 11.5–15.5)
WBC: 8.9 10*3/uL (ref 4.0–10.5)

## 2017-06-11 MED ORDER — FERROUS SULFATE 325 (65 FE) MG PO TABS: 325.0000 mg | ORAL_TABLET | Freq: Two times a day (BID) | ORAL | 2 refills | Status: DC

## 2017-06-11 MED ORDER — IBUPROFEN 600 MG PO TABS: 600.0000 mg | ORAL_TABLET | Freq: Four times a day (QID) | ORAL | Status: DC

## 2017-06-11 MED ORDER — WITCH HAZEL-GLYCERIN EX PADS: 1.0000 "application " | MEDICATED_PAD | CUTANEOUS | Status: DC | PRN

## 2017-06-11 MED ORDER — ONDANSETRON HCL 4 MG PO TABS: 4.0000 mg | ORAL_TABLET | ORAL | Status: DC | PRN

## 2017-06-11 MED ORDER — DIPHENHYDRAMINE HCL 25 MG PO CAPS: 25.0000 mg | ORAL_CAPSULE | Freq: Four times a day (QID) | ORAL | Status: DC | PRN

## 2017-06-11 MED ORDER — ZOLPIDEM TARTRATE 5 MG PO TABS: 5.0000 mg | ORAL_TABLET | Freq: Every evening | ORAL | Status: DC | PRN

## 2017-06-11 MED ORDER — ACETAMINOPHEN 325 MG PO TABS: 650.0000 mg | ORAL_TABLET | ORAL | Status: DC | PRN

## 2017-06-11 MED ORDER — COCONUT OIL OIL
1.0000 | TOPICAL_OIL | Status: DC | PRN
Start: 2017-06-11 — End: 2017-06-11
  Filled 2017-06-11: qty 120

## 2017-06-11 MED ORDER — PRENATAL MULTIVITAMIN CH: 1.0000 | ORAL_TABLET | Freq: Every day | ORAL | Status: DC

## 2017-06-11 MED ORDER — SIMETHICONE 80 MG PO CHEW: 80.0000 mg | CHEWABLE_TABLET | ORAL | Status: DC | PRN

## 2017-06-11 MED ORDER — ONDANSETRON HCL 4 MG/2ML IJ SOLN: 4.0000 mg | INTRAMUSCULAR | Status: DC | PRN

## 2017-06-11 MED ORDER — OXYTOCIN 40 UNITS IN LACTATED RINGERS INFUSION - SIMPLE MED
2.5000 [IU]/h | INTRAVENOUS | Status: DC
  Administered 2017-06-11: 2.5 [IU]/h via INTRAVENOUS

## 2017-06-11 MED ORDER — TETANUS-DIPHTH-ACELL PERTUSSIS 5-2.5-18.5 LF-MCG/0.5 IM SUSP: 0.5000 mL | Freq: Once | INTRAMUSCULAR | Status: DC

## 2017-06-11 MED ORDER — DIBUCAINE 1 % RE OINT
1.0000 "application " | TOPICAL_OINTMENT | RECTAL | Status: DC | PRN
  Filled 2017-06-11: qty 28

## 2017-06-11 MED ORDER — BENZOCAINE-MENTHOL 20-0.5 % EX AERO
1.0000 "application " | INHALATION_SPRAY | CUTANEOUS | Status: DC | PRN
  Filled 2017-06-11: qty 56

## 2017-06-11 MED ORDER — IBUPROFEN 600 MG PO TABS: 600.0000 mg | ORAL_TABLET | Freq: Four times a day (QID) | ORAL | 0 refills | Status: DC | PRN

## 2017-06-11 MED ORDER — SENNOSIDES-DOCUSATE SODIUM 8.6-50 MG PO TABS: 2.0000 | ORAL_TABLET | ORAL | Status: DC

## 2017-06-11 NOTE — Progress Notes (Signed)
Patient discharged with mother.  Discharge instructions reviewed.  Follow-up appointment planned with ob provider.

## 2017-06-11 NOTE — Discharge Summary (Signed)
OB Discharge Summary     Patient Name: Melissa Wilkins DOB: [birth year] MRN: [id number]  Date of admission: 06/10/2017 Delivering MD: Illene Bolus A   Date of discharge: 06/11/2017  Admitting diagnosis: 20 WKS STOMACH PAIN SPOTTING Intrauterine pregnancy: [redacted]w[redacted]d     Secondary diagnosis:  Active Problems:   Premature labor before [redacted] weeks gestation   IUFD at 20 weeks or more of gestation Anhydramnios Prolonged preterm premature rupture of membranes      Discharge diagnosis: Preterm Pregnancy Delivered                                                                                                Post partum procedures:N/A  Augmentation: N/A  Complications: [facility] course:  Onset of Labor With Vaginal Delivery     37 y.o. yo G1P1 at [redacted]w[redacted]d was admitted in Active Labor on 06/10/2017. Patient had an labor course as follows: presented to MAU with vaginal bleeding and contractions, 10cm dilated with bulging bag of membranes.    Intrapartum Procedures: Episiotomy: None [1]                                         Lacerations:  None [1]  Patient had a delivery of a Non Viable infant. 06/10/2017  Information for the patient's newborn:  Estie, Sproule [[id number]]  Delivery Method: Vaginal, Spontaneous(Filed from Delivery Summary)    Pateint had an uncomplicated postpartum course. She is ambulating, tolerating a regular diet, passing flatus, and urinating well. Patient is discharged home in stable condition on 06/11/17.   Physical exam  Vitals:   06/11/17 0301 06/11/17 0331 06/11/17 0401 06/11/17 0500  BP: (!) 93/49 (!) 106/57 (!) 105/56 104/61  Pulse: 73 71 69 68  Resp: Temp:      TempSrc:      Weight:      Height:       General: alert, cooperative and no distress, flat affect  Lochia: appropriate Uterine Fundus: firm Incision: N/A DVT Evaluation: No evidence of DVT seen on physical exam. Negative Homan's sign. No cords or calf  tenderness. No significant calf/ankle edema.  Vitals:   06/11/17 0301 06/11/17 0331 06/11/17 0401 06/11/17 0500  BP: (!) 93/49 (!) 106/57 (!) 105/56 104/61  Pulse: 73 71 69 68  Resp: Temp:      TempSrc:      Weight:      Height:       Labs: Lab Results  Component Value Date   WBC 8.9 06/11/2017   HGB 8.8 (L) 06/11/2017   HCT 25.7 (L) 06/11/2017   MCV 91.8 06/11/2017   PLT 249 06/11/2017   CMP Latest Ref Rng & Units 06/10/2017  Glucose 65 - 99 mg/dL 95  BUN 6 - 20 mg/dL 6  Creatinine 7.82 - 9.56 mg/dL 2.13  Sodium 086 - 578 mmol/L 137  Potassium 3.5 - 5.1 mmol/L 3.9  Chloride 101 - 111 mmol/L 103  CO2 22 - 32 mmol/L 22  Calcium 8.9 - 10.3 mg/dL 9.7  Total Protein 6.5 - 8.1 g/dL 7.4  Total Bilirubin 0.3 - 1.2 mg/dL 0.8  Alkaline Phos 38 - 126 U/L 68  AST 15 - 41 U/L 20  ALT 14 - 54 U/L 15    Discharge instruction: per After Visit Summary  After visit meds:  Allergies as of 06/11/2017   No Known Allergies     Medication List    TAKE these medications   ferrous sulfate 325 (65 FE) MG tablet Take 1 tablet (325 mg total) by mouth 2 (two) times daily with a meal.   ibuprofen 600 MG tablet Commonly known as:  ADVIL,MOTRIN Take 1 tablet (600 mg total) by mouth every 6 (six) hours as needed for mild pain, moderate pain or cramping.   prenatal multivitamin Tabs tablet Take 1 tablet by mouth daily at 12 noon.       Diet: routine diet  Activity: Advance as tolerated. Pelvic rest for 6 weeks.   Outpatient follow up:6 weeks Follow up Appt: Future Appointments  Date Time Provider Department Center  06/28/2017  9:45 AM [facility] [place] 2 [facility]   Follow up Visit: Patient to make appointment with [facility] in 1 week, patient verbalizes she will call earlier if she experiences postpartum depression or anxiety related to her loss.  Postpartum contraception: Undecided  Newborn Data: Female, IUFD  Birth Weight: 6.7 oz (190 g) APGAR: 0, 0  Newborn  Delivery   Birth date/time:  06/10/2017 23:20:00 Delivery type:  Vaginal, Spontaneous     Baby Feeding: n/a Disposition:morgue   06/11/2017 Janeece Riggers, CNM

## 2017-06-11 NOTE — Discharge Instructions (Signed)
Vaginal Delivery, Care After °Refer to this sheet in the next few weeks. These instructions provide you with information on caring for yourself after your delivery. Your health care provider may also give you more specific instructions. Your treatment has been planned according to current medical practices, but problems sometimes occur. Call your health care provider if you have any problems or questions after your delivery. °What to expect after your delivery °After your delivery, it is typical to have the following: °· You may feel pain in the vaginal area for several days after delivery. If you had an incision or a vaginal tear, the area will probably continue to be tender to the touch for several weeks. °· You may feel very fatigued after a vaginal delivery. °· You may have vaginal bleeding and discharge that will start out red, then become pink, then yellow, then white. Altogether, this usually lasts for about 6 weeks. °· The combination of having lost your baby and changing hormones from the delivery can make you feel very sad. You may also experience emotions that change very quickly. Some of the emotions people often notice after loss include: °? Anger. °? Denial. °? Guilt. °? Sorrow. °? Depression. °? Grief. °? Relationship problems. ° °Follow these instructions at home: °· Consider seeking support for your loss. Some forms of support that you might consider include your religious leader, friends, family, a professional counselor, or a bereavement support group. °· Take medicines only as directed by your health care provider. °· Continue to use good perineal care. Good perineal care includes: °? Wiping your perineum from front to back. °? Keeping your perineum clean. °· Do not use tampons or douche until your health care provider says it is okay. °· Shower, wash your hair, and take tub baths as directed by your health care provider. °· Wear a well-fitting bra that provides breast support. °· Drink enough  fluids to keep your urine clear or pale yellow. °· Eat healthy foods. °· Eat high-fiber foods every day, such as whole grain cereals and breads, brown rice, beans, and fresh fruits and vegetables. These foods may help prevent or relieve constipation. °· Follow your health care provider's directions about resuming activities such as climbing stairs, driving, lifting, exercising, or traveling. °· Increase your activities gradually. °· Talk to your health care provider about resuming sexual activities. This depends on your risk of infection, your rate of healing, and your comfort and desire to resume sexual activity. °· Try to have someone help you with your household activities for at least a few days after you leave the hospital. °· Rest as much as possible. °· Keep all of your scheduled postpartum appointments. It is very important to keep your scheduled follow-up appointments. At these appointments, your health care provider will be checking to make sure that you are healing physically and emotionally. °· Do not drink alcohol, especially if you are taking medicine to relieve pain. °· Do not use any tobacco products including cigarettes, chewing tobacco, or electronic cigarettes. If you need help quitting, ask your health care provider. °· Do not use illegal drugs. °Contact a health care provider if: °· You feel sad or depressed. °· You have thoughts of hurting yourself. °· You are having trouble eating or sleeping. °· You cannot enjoy the things in life you have previously enjoyed. °· You are passing large clots from your vagina. Save any clots to show your health care provider. °· You have a bad smelling discharge from your vagina. °·   You have trouble urinating. °· You are urinating frequently. °· You have pain when you urinate. °· You have a change in your bowel movements. °· You have increasing redness, pain, or swelling near your incision or vaginal tear. °· You have pus draining from your incision or vaginal  tear. °· Your incision or vaginal tear is separating. °· You have painful, hard, or reddened breasts. °· You have a severe headache. °· You have blurred vision or see spots. °· You are dizzy or light-headed. °· You have a rash. °· You have nausea or vomiting. °· You have not had a menstrual period by the 12th week after delivery. °· You have a fever. °Get help right away if: °· You are concerned that you may hurt yourself or you are considering suicide. °· You have persistent pain. °· You have chest pain. °· You have shortness of breath. °· You faint. °· You have leg pain. °· You have stomach pain. °· Your vaginal bleeding saturates two or more sanitary pads in 1 hour. °This information is not intended to replace advice given to you by your health care provider. Make sure you discuss any questions you have with your health care provider. °Document Released: 06/04/2013 Document Revised: 06/26/2015 Document Reviewed: 03/08/2013 °Elsevier Interactive Patient Education © 2018 [employer] ° °

## 2017-06-13 LAB — GC/CHLAMYDIA PROBE AMP (~~LOC~~) NOT AT ARMC
CHLAMYDIA, DNA PROBE: NEGATIVE
NEISSERIA GONORRHEA: NEGATIVE

## 2017-06-28 ENCOUNTER — Encounter (HOSPITAL_COMMUNITY): Payer: Self-pay

## 2017-06-28 ENCOUNTER — Ambulatory Visit (HOSPITAL_COMMUNITY)
Admission: RE | Admit: 2017-06-28 | Discharge: 2017-06-28 | Disposition: A | Discharge status: Home / Self Care | Payer: BLUE CROSS/BLUE SHIELD | Source: Ambulatory Visit | Attending: Obstetrics and Gynecology | Admitting: Obstetrics and Gynecology

## 2018-01-27 LAB — OB RESULTS CONSOLE GC/CHLAMYDIA
Chlamydia: NEGATIVE
Gonorrhea: NEGATIVE

## 2018-01-27 LAB — OB RESULTS CONSOLE HEPATITIS B SURFACE ANTIGEN: Hepatitis B Surface Ag: NEGATIVE

## 2018-01-27 LAB — OB RESULTS CONSOLE HIV ANTIBODY (ROUTINE TESTING): HIV: NONREACTIVE

## 2018-01-27 LAB — OB RESULTS CONSOLE RUBELLA ANTIBODY, IGM: Rubella: IMMUNE

## 2018-01-27 LAB — OB RESULTS CONSOLE RPR: RPR: NONREACTIVE

## 2018-01-27 LAB — OB RESULTS CONSOLE ABO/RH: RH Type: POSITIVE

## 2018-01-27 LAB — OB RESULTS CONSOLE ANTIBODY SCREEN: Antibody Screen: NEGATIVE

## 2018-03-20 ENCOUNTER — Encounter (HOSPITAL_COMMUNITY): Payer: Self-pay | Admitting: *Deleted

## 2018-03-20 ENCOUNTER — Inpatient Hospital Stay (HOSPITAL_COMMUNITY)
Admission: AD | Admit: 2018-03-20 | Discharge: 2018-03-20 | Disposition: A | Discharge status: Home / Self Care | Payer: BLUE CROSS/BLUE SHIELD | Attending: Obstetrics & Gynecology | Admitting: Obstetrics & Gynecology

## 2018-03-20 ENCOUNTER — Other Ambulatory Visit: Payer: Self-pay

## 2018-03-20 DIAGNOSIS — Z3A17 17 weeks gestation of pregnancy: Secondary | ICD-10-CM | POA: Diagnosis not present

## 2018-03-20 DIAGNOSIS — K029 Dental caries, unspecified: Secondary | ICD-10-CM | POA: Insufficient documentation

## 2018-03-20 DIAGNOSIS — O26892 Other specified pregnancy related conditions, second trimester: Secondary | ICD-10-CM | POA: Insufficient documentation

## 2018-03-20 DIAGNOSIS — R6884 Jaw pain: Secondary | ICD-10-CM | POA: Diagnosis present

## 2018-03-20 MED ORDER — AMOXICILLIN-POT CLAVULANATE 875-125 MG PO TABS
1.0000 | ORAL_TABLET | Freq: Two times a day (BID) | ORAL | 0 refills | Status: DC
Start: 2018-03-20 — End: 2018-08-18

## 2018-03-20 NOTE — MAU Provider Note (Signed)
History     CSN: [id number]  Arrival date and time: 03/20/18 1521   First Provider Initiated Contact with Patient 03/20/18 1627      Chief Complaint  Patient presents with  . Jaw Pain   HPI Melissa Wilkins 38 y.o. [redacted]w[redacted]d  Comes to MAU with swelling in her jaw and pain with one tooth.  Needs to see a dentist but has not yet seen one.  Having pain opening her mouth wide.  Has taken tylenol but has had very little relief.  Usually takes Motrin with a toothache.  Has not yet started prenatal care.  OB History    Gravida  2   Para  1   Term      Preterm      AB      Living  0     SAB      TAB      Ectopic      Multiple  0   Live Births  0           Past Medical History:  Diagnosis Date  . Trichomonas vaginitis   . Vaginal Pap smear, abnormal    biopsy     Past Surgical History:  Procedure Laterality Date  . NO PAST SURGERIES      Family History  Problem Relation Age of Onset  . Diabetes Maternal Aunt   . Hyperlipidemia Maternal Aunt   . Diabetes Maternal Grandmother   . Hyperlipidemia Maternal Grandmother     Social History   Tobacco Use  . Smoking status: Never Smoker  . Smokeless tobacco: Never Used  Substance Use Topics  . Alcohol use: Yes    Comment: social drinker  . Drug use: No    Allergies: No Known Allergies  No medications prior to admission.    Review of Systems  Constitutional: Negative for fever.       Not able to eat  HENT: Positive for dental problem. Negative for facial swelling and trouble swallowing.        Jaw pain - lump in jaw Not able to open mouth wide  Gastrointestinal: Negative for nausea and vomiting.   Physical Exam   Blood pressure 135/83, pulse 94, temperature 98.4 F (36.9 C), temperature source Oral, resp. rate 18, weight 83.7 kg, SpO2 100 %.  Physical Exam  Nursing note and vitals reviewed. Constitutional: She is oriented to person, place, and time. She appears well-developed and  well-nourished.  HENT:  Head: Normocephalic.  Unable to open mouth fully but can see tooth in lower left jawline that is decayed to the gumline.  No redness seen in the gum but difficult to fully assess.  Eyes: Pupils are equal, round, and reactive to light. Conjunctivae and EOM are normal.  Neck: Normal range of motion. Neck supple.  Can feel the "lump" under the client's left jawline - feels like an enlarged lymph node.  Client tolerates palpation without evidence of severe pain.  No swelling noted in jaw.  No redness.  GI: Soft.  Musculoskeletal: Normal range of motion.  Neurological: She is alert and oriented to person, place, and time.  Skin: Skin is warm and dry.  Psychiatric: She has a normal mood and affect.    MAU Course  Procedures  MDM Likely there is an enlarged lymph node in her jaw as there is no visual swelling or distortion seen. Client planning to get her tooth pulled.  Just has to see the dentist.  Getting insurance  card on Friday but will try to get the admissions clerk to print off her insurnance card number here.  Assessment and Plan  Decayed tooth and likely a tooth abscess  Plan Prescribed Augmentin 875 mt BID for 10 days to make sure there is no worsening of her tooth and jaw. Advised orajel Adivsed a one week course of Advil or Motrin by the package directions. Cautioned no Advil in third trimester. SEE a dentist ASAP. Eat small amounts every hour of liquids.  Avoid cold or very hot liquids. See AVS for additional information given to client.   Terri L Burleson 03/20/2018, 5:15 PM

## 2018-03-20 NOTE — Discharge Instructions (Signed)
See a dentist as soon as possible. There is a Education officer, community on [address] or [place] that can see pregnant clients.

## 2018-03-20 NOTE — MAU Note (Signed)
Under her jaw hurts, going up to her tooth.  Started Saturday night. Called the office, has a new dentist.  Unable to get in.   Took Tylenol, didn't really work.

## 2018-06-30 LAB — OB RESULTS CONSOLE GBS: GBS: POSITIVE

## 2018-08-08 ENCOUNTER — Encounter (HOSPITAL_COMMUNITY): Payer: Self-pay | Admitting: *Deleted

## 2018-08-08 ENCOUNTER — Telehealth (HOSPITAL_COMMUNITY): Payer: Self-pay | Admitting: *Deleted

## 2018-08-08 NOTE — Telephone Encounter (Signed)
Preadmission screen  

## 2018-08-16 ENCOUNTER — Other Ambulatory Visit (HOSPITAL_COMMUNITY)
Admission: RE | Admit: 2018-08-16 | Discharge: 2018-08-16 | Disposition: A | Discharge status: Home / Self Care | Payer: BC Managed Care – PPO | Source: Ambulatory Visit | Attending: Obstetrics & Gynecology | Admitting: Obstetrics & Gynecology

## 2018-08-16 ENCOUNTER — Other Ambulatory Visit: Payer: Self-pay | Admitting: Obstetrics & Gynecology

## 2018-08-16 ENCOUNTER — Other Ambulatory Visit: Payer: Self-pay

## 2018-08-16 DIAGNOSIS — Z1159 Encounter for screening for other viral diseases: Secondary | ICD-10-CM | POA: Diagnosis present

## 2018-08-16 LAB — SARS CORONAVIRUS 2 (TAT 6-24 HRS): SARS Coronavirus 2: NEGATIVE

## 2018-08-16 NOTE — MAU Note (Signed)
Covid swab collected. PT tolerated well.Pt asymptomatic 

## 2018-08-18 ENCOUNTER — Other Ambulatory Visit: Payer: Self-pay

## 2018-08-18 ENCOUNTER — Encounter (HOSPITAL_COMMUNITY): Payer: Self-pay | Admitting: *Deleted

## 2018-08-18 ENCOUNTER — Encounter (HOSPITAL_COMMUNITY)
Admission: AD | Disposition: A | Discharge status: Home / Self Care | Payer: Self-pay | Source: Home / Self Care | Attending: Obstetrics & Gynecology

## 2018-08-18 ENCOUNTER — Inpatient Hospital Stay (HOSPITAL_COMMUNITY)
Admission: AD | Admit: 2018-08-18 | Discharge: 2018-08-20 | DRG: 787 | Disposition: A | Discharge status: Home / Self Care | Payer: BC Managed Care – PPO | Attending: Obstetrics & Gynecology | Admitting: Obstetrics & Gynecology

## 2018-08-18 ENCOUNTER — Inpatient Hospital Stay (HOSPITAL_COMMUNITY): Payer: BC Managed Care – PPO | Admitting: Anesthesiology

## 2018-08-18 ENCOUNTER — Inpatient Hospital Stay (HOSPITAL_COMMUNITY): Payer: BC Managed Care – PPO

## 2018-08-18 DIAGNOSIS — D62 Acute posthemorrhagic anemia: Secondary | ICD-10-CM | POA: Diagnosis not present

## 2018-08-18 DIAGNOSIS — O99824 Streptococcus B carrier state complicating childbirth: Secondary | ICD-10-CM | POA: Diagnosis present

## 2018-08-18 DIAGNOSIS — O99892 Other specified diseases and conditions complicating childbirth: Secondary | ICD-10-CM | POA: Diagnosis present

## 2018-08-18 DIAGNOSIS — Z3490 Encounter for supervision of normal pregnancy, unspecified, unspecified trimester: Secondary | ICD-10-CM | POA: Diagnosis present

## 2018-08-18 DIAGNOSIS — Z3A39 39 weeks gestation of pregnancy: Secondary | ICD-10-CM | POA: Diagnosis not present

## 2018-08-18 DIAGNOSIS — O24425 Gestational diabetes mellitus in childbirth, controlled by oral hypoglycemic drugs: Principal | ICD-10-CM | POA: Diagnosis present

## 2018-08-18 DIAGNOSIS — O9081 Anemia of the puerperium: Secondary | ICD-10-CM | POA: Diagnosis not present

## 2018-08-18 DIAGNOSIS — D573 Sickle-cell trait: Secondary | ICD-10-CM | POA: Diagnosis present

## 2018-08-18 DIAGNOSIS — O3663X0 Maternal care for excessive fetal growth, third trimester, not applicable or unspecified: Secondary | ICD-10-CM | POA: Diagnosis present

## 2018-08-18 LAB — CBC
HCT: 37.1 % (ref 36.0–46.0)
HCT: 23.7 % — ABNORMAL LOW (ref 36.0–46.0)
Hemoglobin: 12.6 g/dL (ref 12.0–15.0)
Hemoglobin: 8 g/dL — ABNORMAL LOW (ref 12.0–15.0)
MCH: 30.9 pg (ref 26.0–34.0)
MCH: 31.1 pg (ref 26.0–34.0)
MCHC: 34 g/dL (ref 30.0–36.0)
MCHC: 33.8 g/dL (ref 30.0–36.0)
MCV: 90.9 fL (ref 80.0–100.0)
MCV: 92.2 fL (ref 80.0–100.0)
Platelets: 176 10*3/uL (ref 150–400)
Platelets: 166 10*3/uL (ref 150–400)
RBC: 4.08 MIL/uL (ref 3.87–5.11)
RBC: 2.57 MIL/uL — ABNORMAL LOW (ref 3.87–5.11)
RDW: 14.1 % (ref 11.5–15.5)
RDW: 14.3 % (ref 11.5–15.5)
WBC: 8.4 10*3/uL (ref 4.0–10.5)
WBC: 8.2 10*3/uL (ref 4.0–10.5)
nRBC: 0 % (ref 0.0–0.2)
nRBC: 0 % (ref 0.0–0.2)

## 2018-08-18 LAB — PROTIME-INR
INR: 1.4 — ABNORMAL HIGH (ref 0.8–1.2)
Prothrombin Time: 16.8 seconds — ABNORMAL HIGH (ref 11.4–15.2)

## 2018-08-18 LAB — ABO/RH: ABO/RH(D): A POS

## 2018-08-18 LAB — PREPARE RBC (CROSSMATCH)

## 2018-08-18 LAB — RPR: RPR Ser Ql: NONREACTIVE

## 2018-08-18 LAB — APTT: aPTT: 29 seconds (ref 24–36)

## 2018-08-18 LAB — FIBRINOGEN: Fibrinogen: 338 mg/dL (ref 210–475)

## 2018-08-18 SURGERY — Surgical Case
Anesthesia: Spinal | Wound class: Clean Contaminated

## 2018-08-18 MED ORDER — NALBUPHINE HCL 10 MG/ML IJ SOLN: 5.0000 mg | Freq: Once | INTRAMUSCULAR | Status: DC | PRN

## 2018-08-18 MED ORDER — NALBUPHINE HCL 10 MG/ML IJ SOLN: 5.0000 mg | INTRAMUSCULAR | Status: DC | PRN

## 2018-08-18 MED ORDER — ONDANSETRON HCL 4 MG/2ML IJ SOLN
INTRAMUSCULAR | Status: DC | PRN
  Administered 2018-08-18: 4 mg via INTRAVENOUS

## 2018-08-18 MED ORDER — DIPHENHYDRAMINE HCL 50 MG/ML IJ SOLN: 12.5000 mg | INTRAMUSCULAR | Status: DC | PRN

## 2018-08-18 MED ORDER — PROMETHAZINE HCL 25 MG/ML IJ SOLN
12.5000 mg | Freq: Once | INTRAMUSCULAR | Status: AC
  Administered 2018-08-18: 12.5 mg via INTRAVENOUS
  Filled 2018-08-18: qty 1

## 2018-08-18 MED ORDER — PROMETHAZINE HCL 25 MG/ML IJ SOLN: 6.2500 mg | INTRAMUSCULAR | Status: DC | PRN

## 2018-08-18 MED ORDER — TRANEXAMIC ACID-NACL 1000-0.7 MG/100ML-% IV SOLN
INTRAVENOUS | Status: AC
  Filled 2018-08-18: qty 100

## 2018-08-18 MED ORDER — OXYTOCIN 40 UNITS IN NORMAL SALINE INFUSION - SIMPLE MED: 2.5000 [IU]/h | INTRAVENOUS | Status: AC

## 2018-08-18 MED ORDER — FENTANYL CITRATE (PF) 100 MCG/2ML IJ SOLN
INTRAMUSCULAR | Status: AC
  Filled 2018-08-18: qty 2

## 2018-08-18 MED ORDER — LACTATED RINGERS IV SOLN
INTRAVENOUS | Status: DC
  Administered 2018-08-18 (×2): via INTRAVENOUS

## 2018-08-18 MED ORDER — OXYTOCIN 40 UNITS IN NORMAL SALINE INFUSION - SIMPLE MED
INTRAVENOUS | Status: AC
  Filled 2018-08-18: qty 1000

## 2018-08-18 MED ORDER — SODIUM CHLORIDE 0.9% IV SOLUTION: Freq: Once | INTRAVENOUS | Status: DC

## 2018-08-18 MED ORDER — BUPIVACAINE IN DEXTROSE 0.75-8.25 % IT SOLN
INTRATHECAL | Status: DC | PRN
  Administered 2018-08-18: 1.6 mL via INTRATHECAL

## 2018-08-18 MED ORDER — DIBUCAINE (PERIANAL) 1 % EX OINT: 1.0000 "application " | TOPICAL_OINTMENT | CUTANEOUS | Status: DC | PRN

## 2018-08-18 MED ORDER — SCOPOLAMINE 1 MG/3DAYS TD PT72: 1.0000 | MEDICATED_PATCH | Freq: Once | TRANSDERMAL | Status: DC

## 2018-08-18 MED ORDER — ONDANSETRON HCL 4 MG/2ML IJ SOLN
4.0000 mg | Freq: Three times a day (TID) | INTRAMUSCULAR | Status: DC | PRN
  Filled 2018-08-18 (×2): qty 2

## 2018-08-18 MED ORDER — ONDANSETRON HCL 4 MG/2ML IJ SOLN
4.0000 mg | Freq: Once | INTRAMUSCULAR | Status: AC
  Administered 2018-08-18: 4 mg via INTRAVENOUS

## 2018-08-18 MED ORDER — OXYTOCIN 40 UNITS IN NORMAL SALINE INFUSION - SIMPLE MED: 2.5000 [IU]/h | INTRAVENOUS | Status: DC

## 2018-08-18 MED ORDER — OXYCODONE HCL 5 MG/5ML PO SOLN: 5.0000 mg | Freq: Once | ORAL | Status: DC | PRN

## 2018-08-18 MED ORDER — ACETAMINOPHEN 325 MG PO TABS
650.0000 mg | ORAL_TABLET | Freq: Four times a day (QID) | ORAL | Status: DC | PRN
  Administered 2018-08-18 – 2018-08-19 (×2): 650 mg via ORAL
  Filled 2018-08-18 (×2): qty 2

## 2018-08-18 MED ORDER — CEFAZOLIN SODIUM-DEXTROSE 2-3 GM-%(50ML) IV SOLR
INTRAVENOUS | Status: DC | PRN
  Administered 2018-08-18: 2 g via INTRAVENOUS

## 2018-08-18 MED ORDER — ACETAMINOPHEN 10 MG/ML IV SOLN
INTRAVENOUS | Status: AC
  Filled 2018-08-18: qty 100

## 2018-08-18 MED ORDER — SIMETHICONE 80 MG PO CHEW
80.0000 mg | CHEWABLE_TABLET | ORAL | Status: DC
  Administered 2018-08-18 – 2018-08-20 (×2): 80 mg via ORAL
  Filled 2018-08-18 (×2): qty 1

## 2018-08-18 MED ORDER — MORPHINE SULFATE (PF) 0.5 MG/ML IJ SOLN
INTRAMUSCULAR | Status: DC | PRN
  Administered 2018-08-18: .15 mg via INTRATHECAL

## 2018-08-18 MED ORDER — SENNOSIDES-DOCUSATE SODIUM 8.6-50 MG PO TABS
2.0000 | ORAL_TABLET | ORAL | Status: DC
  Administered 2018-08-18 – 2018-08-20 (×2): 2 via ORAL
  Filled 2018-08-18 (×3): qty 2

## 2018-08-18 MED ORDER — HYDROMORPHONE HCL 1 MG/ML IJ SOLN: 0.2500 mg | INTRAMUSCULAR | Status: DC | PRN

## 2018-08-18 MED ORDER — LIDOCAINE HCL (PF) 1 % IJ SOLN: 30.0000 mL | INTRAMUSCULAR | Status: DC | PRN

## 2018-08-18 MED ORDER — METHYLERGONOVINE MALEATE 0.2 MG/ML IJ SOLN
INTRAMUSCULAR | Status: DC | PRN
  Administered 2018-08-18: 0.2 mg via INTRAMUSCULAR

## 2018-08-18 MED ORDER — FENTANYL CITRATE (PF) 100 MCG/2ML IJ SOLN
50.0000 ug | Freq: Once | INTRAMUSCULAR | Status: AC
  Administered 2018-08-18: 50 ug via INTRAVENOUS

## 2018-08-18 MED ORDER — DIPHENHYDRAMINE HCL 25 MG PO CAPS: 25.0000 mg | ORAL_CAPSULE | ORAL | Status: DC | PRN

## 2018-08-18 MED ORDER — ACETAMINOPHEN 10 MG/ML IV SOLN
1000.0000 mg | Freq: Once | INTRAVENOUS | Status: AC
  Administered 2018-08-18: 1000 mg via INTRAVENOUS

## 2018-08-18 MED ORDER — MENTHOL 3 MG MT LOZG: 1.0000 | LOZENGE | OROMUCOSAL | Status: DC | PRN

## 2018-08-18 MED ORDER — WITCH HAZEL-GLYCERIN EX PADS: 1.0000 "application " | MEDICATED_PAD | CUTANEOUS | Status: DC | PRN

## 2018-08-18 MED ORDER — FENTANYL CITRATE (PF) 100 MCG/2ML IJ SOLN
INTRAMUSCULAR | Status: DC | PRN
  Administered 2018-08-18: 15 ug via INTRATHECAL

## 2018-08-18 MED ORDER — SIMETHICONE 80 MG PO CHEW
80.0000 mg | CHEWABLE_TABLET | Freq: Three times a day (TID) | ORAL | Status: DC
  Administered 2018-08-18 – 2018-08-20 (×5): 80 mg via ORAL
  Filled 2018-08-18 (×5): qty 1

## 2018-08-18 MED ORDER — SODIUM CHLORIDE 0.9% FLUSH: 3.0000 mL | INTRAVENOUS | Status: DC | PRN

## 2018-08-18 MED ORDER — PHENYLEPHRINE HCL-NACL 20-0.9 MG/250ML-% IV SOLN
INTRAVENOUS | Status: AC
  Filled 2018-08-18: qty 250

## 2018-08-18 MED ORDER — SIMETHICONE 80 MG PO CHEW: 80.0000 mg | CHEWABLE_TABLET | ORAL | Status: DC | PRN

## 2018-08-18 MED ORDER — PRENATAL MULTIVITAMIN CH
1.0000 | ORAL_TABLET | Freq: Every day | ORAL | Status: DC
  Filled 2018-08-18: qty 1

## 2018-08-18 MED ORDER — CEFAZOLIN SODIUM-DEXTROSE 2-4 GM/100ML-% IV SOLN
INTRAVENOUS | Status: AC
  Filled 2018-08-18: qty 100

## 2018-08-18 MED ORDER — LACTATED RINGERS IV SOLN
INTRAVENOUS | Status: DC
  Administered 2018-08-18 (×3): via INTRAVENOUS

## 2018-08-18 MED ORDER — TRANEXAMIC ACID 1000 MG/10ML IV SOLN
INTRAVENOUS | Status: DC | PRN
  Administered 2018-08-18: 1000 mg via INTRAVENOUS

## 2018-08-18 MED ORDER — OXYTOCIN 40 UNITS IN NORMAL SALINE INFUSION - SIMPLE MED: 1.0000 m[IU]/min | INTRAVENOUS | Status: DC

## 2018-08-18 MED ORDER — PENICILLIN G 3 MILLION UNITS IVPB - SIMPLE MED: 3.0000 10*6.[IU] | INTRAVENOUS | Status: DC

## 2018-08-18 MED ORDER — SODIUM CHLORIDE (PF) 0.9 % IJ SOLN
INTRAMUSCULAR | Status: AC
  Filled 2018-08-18: qty 10

## 2018-08-18 MED ORDER — OXYTOCIN BOLUS FROM INFUSION: 500.0000 mL | Freq: Once | INTRAVENOUS | Status: DC

## 2018-08-18 MED ORDER — OXYCODONE HCL 5 MG PO TABS: 5.0000 mg | ORAL_TABLET | ORAL | Status: DC | PRN

## 2018-08-18 MED ORDER — SOD CITRATE-CITRIC ACID 500-334 MG/5ML PO SOLN
30.0000 mL | ORAL | Status: DC | PRN
  Administered 2018-08-18: 30 mL via ORAL
  Filled 2018-08-18: qty 30

## 2018-08-18 MED ORDER — ONDANSETRON HCL 4 MG/2ML IJ SOLN
INTRAMUSCULAR | Status: AC
  Filled 2018-08-18: qty 2

## 2018-08-18 MED ORDER — NALOXONE HCL 0.4 MG/ML IJ SOLN: 0.4000 mg | INTRAMUSCULAR | Status: DC | PRN

## 2018-08-18 MED ORDER — NALOXONE HCL 4 MG/10ML IJ SOLN
1.0000 ug/kg/h | INTRAVENOUS | Status: DC | PRN
  Filled 2018-08-18: qty 5

## 2018-08-18 MED ORDER — MORPHINE SULFATE (PF) 0.5 MG/ML IJ SOLN
INTRAMUSCULAR | Status: AC
  Filled 2018-08-18: qty 10

## 2018-08-18 MED ORDER — DEXAMETHASONE SODIUM PHOSPHATE 10 MG/ML IJ SOLN
INTRAMUSCULAR | Status: AC
  Filled 2018-08-18: qty 1

## 2018-08-18 MED ORDER — ONDANSETRON HCL 4 MG/2ML IJ SOLN: 4.0000 mg | Freq: Four times a day (QID) | INTRAMUSCULAR | Status: DC | PRN

## 2018-08-18 MED ORDER — COCONUT OIL OIL: 1.0000 "application " | TOPICAL_OIL | Status: DC | PRN

## 2018-08-18 MED ORDER — TERBUTALINE SULFATE 1 MG/ML IJ SOLN: 0.2500 mg | Freq: Once | INTRAMUSCULAR | Status: DC | PRN

## 2018-08-18 MED ORDER — TETANUS-DIPHTH-ACELL PERTUSSIS 5-2.5-18.5 LF-MCG/0.5 IM SUSP: 0.5000 mL | Freq: Once | INTRAMUSCULAR | Status: DC

## 2018-08-18 MED ORDER — MISOPROSTOL 50MCG HALF TABLET
50.0000 ug | ORAL_TABLET | ORAL | Status: DC | PRN
  Administered 2018-08-18: 50 ug via ORAL
  Filled 2018-08-18: qty 1

## 2018-08-18 MED ORDER — MEPERIDINE HCL 25 MG/ML IJ SOLN: 6.2500 mg | INTRAMUSCULAR | Status: DC | PRN

## 2018-08-18 MED ORDER — SODIUM CHLORIDE 0.9 % IV SOLN
5.0000 10*6.[IU] | Freq: Once | INTRAVENOUS | Status: AC
  Administered 2018-08-18: 5 10*6.[IU] via INTRAVENOUS
  Filled 2018-08-18: qty 5

## 2018-08-18 MED ORDER — SODIUM CHLORIDE 0.9 % IV SOLN
INTRAVENOUS | Status: DC | PRN
  Administered 2018-08-18: 05:00:00 via INTRAVENOUS

## 2018-08-18 MED ORDER — ACETAMINOPHEN 325 MG PO TABS: 650.0000 mg | ORAL_TABLET | ORAL | Status: DC | PRN

## 2018-08-18 MED ORDER — FERROUS SULFATE 325 (65 FE) MG PO TABS
325.0000 mg | ORAL_TABLET | Freq: Two times a day (BID) | ORAL | Status: DC
  Administered 2018-08-19 – 2018-08-20 (×3): 325 mg via ORAL
  Filled 2018-08-18 (×3): qty 1

## 2018-08-18 MED ORDER — LACTATED RINGERS IV SOLN: 500.0000 mL | INTRAVENOUS | Status: DC | PRN

## 2018-08-18 MED ORDER — DIPHENHYDRAMINE HCL 25 MG PO CAPS: 25.0000 mg | ORAL_CAPSULE | Freq: Four times a day (QID) | ORAL | Status: DC | PRN

## 2018-08-18 MED ORDER — SODIUM CHLORIDE 0.9 % IV SOLN
INTRAVENOUS | Status: DC | PRN
  Administered 2018-08-18: 05:00:00 40 [IU] via INTRAVENOUS

## 2018-08-18 MED ORDER — PHENYLEPHRINE HCL-NACL 20-0.9 MG/250ML-% IV SOLN
INTRAVENOUS | Status: DC | PRN
  Administered 2018-08-18: 60 ug/min via INTRAVENOUS

## 2018-08-18 MED ORDER — DEXAMETHASONE SODIUM PHOSPHATE 10 MG/ML IJ SOLN
INTRAMUSCULAR | Status: DC | PRN
  Administered 2018-08-18: 10 mg via INTRAVENOUS

## 2018-08-18 MED ORDER — OXYCODONE HCL 5 MG PO TABS: 5.0000 mg | ORAL_TABLET | Freq: Once | ORAL | Status: DC | PRN

## 2018-08-18 SURGICAL SUPPLY — 40 items
BENZOIN TINCTURE PRP APPL 2/3 (GAUZE/BANDAGES/DRESSINGS) ×2 IMPLANT
CHLORAPREP W/TINT 26ML (MISCELLANEOUS) ×3 IMPLANT
CLAMP CORD UMBIL (MISCELLANEOUS) IMPLANT
CLOSURE WOUND 1/2 X4 (GAUZE/BANDAGES/DRESSINGS)
CLOSURE WOUND 1/4 X3 (GAUZE/BANDAGES/DRESSINGS) ×1
CLOTH BEACON ORANGE TIMEOUT ST (SAFETY) ×3 IMPLANT
DRSG OPSITE POSTOP 4X10 (GAUZE/BANDAGES/DRESSINGS) ×3 IMPLANT
ELECT REM PT RETURN 9FT ADLT (ELECTROSURGICAL) ×3
ELECTRODE REM PT RTRN 9FT ADLT (ELECTROSURGICAL) ×1 IMPLANT
EXTRACTOR VACUUM KIWI (MISCELLANEOUS) IMPLANT
EXTRACTOR VACUUM M CUP 4 TUBE (SUCTIONS) IMPLANT
EXTRACTOR VACUUM M CUP 4' TUBE (SUCTIONS)
GLOVE BIO SURGEON STRL SZ7 (GLOVE) ×3 IMPLANT
GLOVE BIOGEL PI IND STRL 7.0 (GLOVE) ×2 IMPLANT
GLOVE BIOGEL PI INDICATOR 7.0 (GLOVE) ×4
GOWN STRL REUS W/TWL LRG LVL3 (GOWN DISPOSABLE) ×6 IMPLANT
KIT ABG SYR 3ML LUER SLIP (SYRINGE) IMPLANT
NDL HYPO 25X5/8 SAFETYGLIDE (NEEDLE) IMPLANT
NEEDLE HYPO 25X5/8 SAFETYGLIDE (NEEDLE) IMPLANT
NS IRRIG 1000ML POUR BTL (IV SOLUTION) ×3 IMPLANT
PACK C SECTION WH (CUSTOM PROCEDURE TRAY) ×3 IMPLANT
PAD ABD DERMACEA PRESS 5X9 (GAUZE/BANDAGES/DRESSINGS) ×2 IMPLANT
PAD OB MATERNITY 4.3X12.25 (PERSONAL CARE ITEMS) ×3 IMPLANT
RTRCTR C-SECT PINK 25CM LRG (MISCELLANEOUS) IMPLANT
STRIP CLOSURE SKIN 1/2X4 (GAUZE/BANDAGES/DRESSINGS) IMPLANT
STRIP CLOSURE SKIN 1/4X3 (GAUZE/BANDAGES/DRESSINGS) ×1 IMPLANT
SUT MNCRL 0 VIOLET CTX 36 (SUTURE) ×2 IMPLANT
SUT MONOCRYL 0 CTX 36 (SUTURE) ×4
SUT PLAIN 0 NONE (SUTURE) IMPLANT
SUT PLAIN 2 0 (SUTURE) ×2
SUT PLAIN ABS 2-0 CT1 27XMFL (SUTURE) IMPLANT
SUT VIC AB 0 CT1 27 (SUTURE) ×4
SUT VIC AB 0 CT1 27XBRD ANBCTR (SUTURE) ×2 IMPLANT
SUT VIC AB 2-0 CT1 27 (SUTURE) ×4
SUT VIC AB 2-0 CT1 TAPERPNT 27 (SUTURE) ×1 IMPLANT
SUT VIC AB 4-0 KS 27 (SUTURE) ×3 IMPLANT
SUT VICRYL 0 TIES 12 18 (SUTURE) IMPLANT
TOWEL OR 17X24 6PK STRL BLUE (TOWEL DISPOSABLE) ×3 IMPLANT
TRAY FOLEY W/BAG SLVR 14FR LF (SET/KITS/TRAYS/PACK) IMPLANT
WATER STERILE IRR 1000ML POUR (IV SOLUTION) ×3 IMPLANT

## 2018-08-18 NOTE — Transfer of Care (Signed)
Immediate Anesthesia Transfer of Care Note  Patient: Melissa Wilkins  Procedure(s) Performed: CESAREAN SECTION (N/A )  Patient Location: PACU  Anesthesia Type:Spinal  Level of Consciousness: awake, alert  and oriented  Airway & Oxygen Therapy: Patient Spontanous Breathing  Post-op Assessment: Report given to RN and Post -op Vital signs reviewed and stable  Post vital signs: Reviewed and stable  Last Vitals:  Vitals Value Taken Time  BP    Temp    Pulse    Resp    SpO2      Last Pain:  Vitals:   08/18/18 0101  TempSrc: Axillary         Complications: No apparent anesthesia complications

## 2018-08-18 NOTE — Progress Notes (Signed)
Late entry-  I received call from LDR at 4.20 am with report of patient bleeding vaginally profusely, 700 cc EFW within10 min. It started few minutes after she got back in the bed after a void, RN was at bedside placing her back on the monitor and witnessed bleeding. Patient was at 5 cm dilation, intact membranes, stn -2, UCs q 3 min and FHT wnl, no decels, cat I. She was asked to get OR ready as I left to come to the hospital. While en route I spoke with LDR charge nurse, who reported bleeding was still ongoing and they had in-house faculty Dr Kennon Rounds step in, who moved the patient back to the OR. As I arrived, patient was sitting up for spinal anesthesia and verbal consent was obtained to proceed with emergency c-section by me. A second IV line was secured.  T&C 4 units ordered.  After Spinal FHT was supposedly noted at 90s, so we splashed with Betadine and proceeded with emergency C-section. Dr Garwin Brothers arrived to assist.  In short, LTCS was uncomplicated, performed under spinal anesthesia. Amniotomy noted clear fluid, no clots or abruption noted. Placenta was not low but lower segment posterior wall noted large amount of oozing, needing figure of eight stitches x 3, also methergine 0.2 mg IM and TXA one dose IV. See op note for details. She was brought to PACU stable Prior to OR EBL at 1859 and in OR was 657 cc including vaginal clots = total 2516 cc  CBC, PT/INR/ PTT, Fibrinogen drawn in OR.  Will transfuse 4 units pRBCs and 1 FFP in PACU  Findings d/w pt.  V.,Carrol Bondar MD

## 2018-08-18 NOTE — Anesthesia Postprocedure Evaluation (Signed)
Anesthesia Post Note  Patient: Melissa Wilkins  Procedure(s) Performed: CESAREAN SECTION (N/A )     Patient location during evaluation: PACU Anesthesia Type: Spinal Level of consciousness: oriented and awake and alert Pain management: pain level controlled Vital Signs Assessment: post-procedure vital signs reviewed and stable Respiratory status: spontaneous breathing and respiratory function stable Cardiovascular status: blood pressure returned to baseline and stable Postop Assessment: no headache, no backache and no apparent nausea or vomiting Anesthetic complications: no    Last Vitals:  Vitals:   08/18/18 0636 08/18/18 0637  BP:    Pulse: 73 73  Resp: 10 10  Temp:    SpO2: 96% 96%    Last Pain:  Vitals:   08/18/18 0630  TempSrc:   PainSc: 0-No pain   Pain Goal:    LLE Motor Response: Purposeful movement (08/18/18 0602) LLE Sensation: Tingling (08/18/18 0602) RLE Motor Response: Non-purposeful movement (08/18/18 0602) RLE Sensation: Numbness (08/18/18 0602)     Epidural/Spinal Function Cutaneous sensation: Able to Wiggle Toes (08/18/18 0630), Patient able to flex knees: Yes (08/18/18 0630), Patient able to lift hips off bed: No (08/18/18 0630), Back pain beyond tenderness at insertion site: No (08/18/18 0630), Progressively worsening motor and/or sensory loss: No (08/18/18 0630)  Lynda Rainwater

## 2018-08-18 NOTE — Anesthesia Procedure Notes (Signed)
Spinal  Patient location during procedure: OB Start time: 08/18/2018 4:35 AM End time: 08/18/2018 4:40 AM Staffing Anesthesiologist: Lynda Rainwater, MD Performed: anesthesiologist  Preanesthetic Checklist Completed: patient identified, surgical consent, pre-op evaluation, timeout performed, IV checked, risks and benefits discussed and monitors and equipment checked Spinal Block Patient position: sitting Prep: site prepped and draped and DuraPrep Patient monitoring: heart rate, cardiac monitor, continuous pulse ox and blood pressure Approach: midline Location: L3-4 Injection technique: single-shot Needle Needle type: Pencan  Needle gauge: 24 G Needle length: 10 cm Assessment Sensory level: T4

## 2018-08-18 NOTE — H&P (Signed)
Melissa Wilkins is a 38 y.o. female presenting for IOL at 40 wks for A2GDM. No UCs/ ROM at admission. Good FMs. AMA, SS trait, PCOS, Infertility, Femara conception. A2GDM on Metformin, LGA baby, Android pelvis, Previa at 18 wks, low placenta at 28 wks, resolved at 34 wks. No bleeding. GBS(+)  G1- chronic bleeding, oligohydramnios, delivered at 21 wks. ALP syndrome w/up negative.  G2- Current.  PNCare from 8 wks, dated by 1st trim sono.  SStrait, pt reports FOB is negative Prior PTD at 21 wks, Nl serial CL, didn't need Makena.  AMA- nl Panorama and anatomy sono.  Previa resolved at 28 wks and low placenta resolved at 34 wks.  GBS bacteruria  A2GDM, poor compliance to testing but started to test better after 32 wks, Metformin 500mg  BID added at 34 wks. Pt reported most Fasting BS <100 and most postmeals at 1 hr <140 with some higher values. Antenatal testing from 32 wks, serial growth sono from 28 wks.  Serial growth sono - last at 38 wks, LGA EFW 7'12", 86% and AC 94% AFI 18 cm, Vx.  OB History    Gravida  2   Para  2   Term  1   Preterm      AB      Living  1     SAB      TAB      Ectopic      Multiple  0   Live Births  1          Past Medical History:  Diagnosis Date  . Gestational diabetes   . Trichomonas vaginitis   . Vaginal Pap smear, abnormal    biopsy    Past Surgical History:  Procedure Laterality Date  . NO PAST SURGERIES     Family History: family history includes Cancer in her maternal grandfather; Diabetes in her maternal aunt and maternal grandmother; Hyperlipidemia in her maternal aunt and maternal grandmother. Social History:  reports that she has never smoked. She has never used smokeless tobacco. She reports current alcohol use. She reports that she does not use drugs.     Maternal Diabetes: Yes:  Diabetes Type:  Insulin/Medication controlled Metformin 500mg  po bid from 34 wks, poor maternal compliance to testing. Genetic Screening:  Normal Panorama Maternal Ultrasounds/Referrals: Normal and Other: Previa resolved at 28 wks, low placenta resolved at 34 wks  Fetal Ultrasounds or other Referrals:  None Maternal Substance Abuse:  No Significant Maternal Medications:  Meds include: Other: Metformin 500mg  bid Significant Maternal Lab Results:  Group B Strep positive  Other Comments:  Mom- SStrait. Reports FOB neg  ROS History Dilation: 2 Effacement (%): 50 Station: -2 Exam by:: L.Mears,RN/E.Macdonald,RN Temperature (!) 97.5 F (36.4 C), temperature source Axillary, resp. rate 16, unknown if currently breastfeeding. Exam Physical Exam  Physical exam:  A&O x 3, no acute distress. Pleasant HEENT neg, no thyromegaly Lungs CTA bilat CV RRR, S1S2 normal Abdo soft, non tender, non acute Extr no edema/ tenderness Pelvic above FHT 130s + accels no decels mod variab cat I Toco none at admission   Prenatal labs: ABO, Rh: --/--/A POS, A POS Performed at [facility] [address], [place], [place] [address]  [phone]) Antibody: NEG (07/17 0107) Rubella: Immune (12/27 0000) RPR: Nonreactive (12/27 0000)  HBsAg: Negative (12/27 0000)  HIV: Non-reactive (12/27 0000)  GBS: Positive (05/29 0000)  Panorama nl XY Glucola failed  Assessment/Plan: 38 yo G2P0100, at 39.2 wks, IOL for  A2GDM, Cytotec x1 dose, then pitocin as needed. PCN for GBS per protocol, FHT cat I BS q 4 hrs  Epidural okay Android pelvis and EFW 9 lbs, increase C/s chance reviewed    Robley FriesVaishali R Jaaliyah Lucatero 08/18/2018

## 2018-08-18 NOTE — Lactation Note (Signed)
This note was copied from a baby's chart. Lactation Consultation Note  Patient Name: Melissa Wilkins [id number] Date: 08/18/2018 Reason for consult: Initial assessment;1st time breastfeeding;NICU baby;Term;Maternal endocrine disorder Type of Endocrine Disorder?: Diabetes(GDM)  15 hours old FT female who is being mostly formula fed at this point, but RN Brooke let LC know that mom is planning on BF. She had a rough start, baby has been at the nursery due to [facility], but she's feeling better now and ready to take baby to the breast; she's  P2 but this is her first time BF, she had a stillbirth at 20 week on her first pregnancy. She reported moderate breast changes during this pregnancy (just pigmentation) she has a Hx of GDM baby is already getting supplemented with [employer].  LC showed mom how to hand express but unable to get colostrum at this point. Noticed that her nipples are short shafted and her tissue is not very compressible, she has areola edema on both breasts, still getting fluids on her IV. [place] set her up with a hand pump and breast shells, instructions, cleaning and storage were reviewed as well as milk storage guidelines.   Offered assistance with latch and mom agreed to wake baby up to feed. LC took baby STS to mother's right breast in football position first and then in cross cradle but baby will not wake up, he was very sleepy and barely opened his mouth. Asked mom to call for assistance when needed, RN Jerene Pitch brought a bottle of Gerber formula for baby to have on his next feeding if he's not still taking the breast. LC also shared formula supplementation guidelines when feeding at the breast. Reviewed normal newborn behavior, feeding cues and cluster feeding.  Feeding plan:  1. Encouraged mom to feed baby STS 8-12 times/12 hours or sooner if feeding cues are present 2. Hand expression and pre-pumping were also encouraged prior feedings at the breast, mom will start offering her  EBM as soon as it becomes available 3. Mom will start wearing her breast shells tomorrow morning, daytime only 4. She'll supplement baby with [employer] in the meantime while working on BF, following formula supplementation guidelines according to baby's age in hours  BF brochure, BF resources and feeding diary were reviewed. Parents reported all questions and concerns were answered, they're both aware of [facility] OP services and will call PRN.   Maternal Data Formula Feeding for Exclusion: No Has patient been taught Hand Expression?: Yes Does the patient have breastfeeding experience prior to this delivery?: No  Feeding Feeding Type: Breast Fed   Interventions Interventions: Breast feeding basics reviewed;Assisted with latch;Skin to skin;Breast massage;Hand express;Pre-pump if needed;Hand pump;Reverse pressure;Breast compression;Adjust position;Support pillows;Shells  Lactation Tools Discussed/Used Tools: Shells;Pump Shell Type: Inverted Breast pump type: Manual WIC Program: No Pump Review: Setup, frequency, and cleaning;Milk Storage Initiated by:: MPeck Date initiated:: 08/18/18   Consult Status Consult Status: Follow-up Date: 08/19/18 Follow-up type: In-patient    Melissa Wilkins 08/18/2018, 10:03 PM

## 2018-08-18 NOTE — Anesthesia Preprocedure Evaluation (Addendum)
Anesthesia Evaluation  Patient identified by MRN, date of birth, ID band Patient awake    Reviewed: Allergy & Precautions, NPO status , Patient's Chart, lab work & pertinent test results  Airway Mallampati: II  TM Distance: >3 FB Neck ROM: Full    Dental no notable dental hx.    Pulmonary neg pulmonary ROS,    Pulmonary exam normal breath sounds clear to auscultation       Cardiovascular negative cardio ROS Normal cardiovascular exam Rhythm:Regular Rate:Normal     Neuro/Psych negative neurological ROS  negative psych ROS   GI/Hepatic negative GI ROS, Neg liver ROS,   Endo/Other  negative endocrine ROSdiabetes  Renal/GU negative Renal ROS  negative genitourinary   Musculoskeletal negative musculoskeletal ROS (+)   Abdominal   Peds negative pediatric ROS (+)  Hematology negative hematology ROS (+)   Anesthesia Other Findings   Reproductive/Obstetrics (+) Pregnancy                             Anesthesia Physical Anesthesia Plan  ASA: II and emergent  Anesthesia Plan: Spinal   Post-op Pain Management:    Induction:   PONV Risk Score and Plan: 2 and Treatment may vary due to age or medical condition  Airway Management Planned: Natural Airway  Additional Equipment:   Intra-op Plan:   Post-operative Plan:   Informed Consent: I have reviewed the patients History and Physical, chart, labs and discussed the procedure including the risks, benefits and alternatives for the proposed anesthesia with the patient or authorized representative who has indicated his/her understanding and acceptance.     Dental advisory given  Plan Discussed with: CRNA  Anesthesia Plan Comments:        Anesthesia Quick Evaluation

## 2018-08-18 NOTE — Op Note (Signed)
Cesarean Section Procedure Note   Melissa Wilkins  08/18/2018  Indications: Intrapartum hemorrhage 1800 cc   Patient admitted for labor induction for A2GDM, Cytotec was started, PCN was started for GBS and she was doing well with no pain. She returned to bed after a void and RN was in her room getting baby on the monitor when pt reported gush of fluid per vagina. RN noted she was hemorrhaging. She bled about 700 cc in 10 min when they called me, Emergency c/section was called as I was en route and faculty Dr Kennon Rounds brought her to OR as bleeding became profuse.   Pre-operative Diagnosis: Primary cesarean section for maternal intrapartum hemorrhage.                                              IOL for A2 GDM. 39.2 wks                                                Post-operative Diagnosis: Same   Surgeon: Surgeon(s) and Role:    * Azucena Fallen, MD - Primary    * Servando Salina, MD - Assisting   Assistants: Derrell Lolling CNM joined after baby delivered   Anesthesia: spinal   Procedure Details:  The patient was brought to OR by Dr Kennon Rounds while I was en route. She was sitting up for spinal, she was counseled and verbal consent obtained. Patient was  identified as Melissa Wilkins and the procedure verified as C-Section Delivery. A Time Out was held and the above information confirmed. 2 gm Ancef given.   After induction of spinal anesthesia, FHT noted to be in 90s. So foley placed, betadine splashed, she was draped and stat C-section started. Spinal was adequate.  A pfannenstiel incision was made and carried down through the subcutaneous tissue to the fascia. Fascial incision was made and extended transversely. The fascia was separated from the underlying rectus tissue superiorly and inferiorly. The peritoneum was identified and entered. Peritoneal incision was extended longitudinally. Alexis-O retractor placed. Bladder was low.  A low transverse uterine incision was made. Clear copious  amniotic fluid drained. Baby was unengaged and head was big, so when delivery was not achieved with fundal pressure alone, Kiwi vacuum was used to complete head delivery. Loose nuchal cord released over head and baby boy was delivered at 4.47 AM. Vigorous cry noted. Immediately cord clamped and cut and  Baby handed to NICU team to attend to maternal bleeding. Apgar scores of 9 at one minute and 9 at five minutes. Cord pH (arterial) and cord blood was obtained for evaluation.  The placenta was not in the lower segment, it was in posterior upper segment location. No abruption noted. But posterior uterine wall of lower segment was bleeding significantly from sinuses. Placenta removed by fundal massage and removed intact. Hysterotomy edges clamped with Rings. Pitocin was running, Methergine 0.2 mg IM given and TXA single IV dose given. Uterine tone improved but lower segment posterior wall continued to ooze profusely, so 3 figure of 8 sutures placed and now bleeding was better. Upper segment bleeding controlled better as uterine tone improved.  The uterine outline, tubes and ovaries appeared normal}. The uterine incision was closed with running locked  sutures of 0Monocryl. A second imbricating layer sutured.  Hemostasis was observed. Alexis retractor removed. Peritoneal closure done with 2-0 Vicryl.  The fascia was then reapproximated with running sutures of 0Vicryl. The subcuticular closure was performed using 2-0plain gut. The skin was closed with 4-0Vicryl. Pressure dressings placed.   Instrument, sponge, and needle counts were correct prior the abdominal closure and were correct at the conclusion of the case.   Findings: Unengaged large fetal head, delivered via Kerr hysterotomy with help of Kiwi vacuum. Apgars 9, 9. Maternal bleeding from lower segment posterior wall when originally posterior previa might have been that resolved at 34 wks but vascularity remained. It was sawed over with figure of 8 stitches.  Methergine, TXA given. Baby did well, clear amniotic fluid, no abruption. Cord Gas (a) pH 7.20   Estimated Blood Loss: 657 mL  And prior to coming to OR EBL 1859 cc   Total IV Fluids: 3000 ml LR   Urine Output: 100CC OF clear urine  Specimens: cord gas (arterial 7.2)  Complications: no complication in OR. Intrapartum hemorrhage prior to arriving to OR  Disposition: PACU - hemodynamically stable.  4 units pRBCs and 1 FFP (INR 1.4) will be given in PACU  Maternal Condition: stable   Baby condition / location:  Couplet care / Skin to Skin  Attending Attestation: I performed the procedure.   Signed: Surgeon(s): Shea EvansMody, Lisel Siegrist, MD Maxie Betterousins, Sheronette, MD

## 2018-08-18 NOTE — Progress Notes (Signed)
After getting 12.5mg  IV Phenergan (@1512 ), pt now tolerating clears; had one [place] [employer] pop and asking for another.  Also wants to try Applesauce or pudding.  Pain 1/10 now. Was also Dizzy earlier when first up to [facility] from PACU.  Had 51mc Fentanyl at 0800 and she thinks this caused her to be very dizzy when opening eyes.  Dizziness has resolved now.  Will continue to monitor.

## 2018-08-19 LAB — BPAM FFP
Blood Product Expiration Date: 202007212359
ISSUE DATE / TIME: 202007170643
Unit Type and Rh: 6200

## 2018-08-19 LAB — PREPARE FRESH FROZEN PLASMA

## 2018-08-19 LAB — CBC
HCT: 28.5 % — ABNORMAL LOW (ref 36.0–46.0)
Hemoglobin: 9.8 g/dL — ABNORMAL LOW (ref 12.0–15.0)
MCH: 31.2 pg (ref 26.0–34.0)
MCHC: 34.4 g/dL (ref 30.0–36.0)
MCV: 90.8 fL (ref 80.0–100.0)
Platelets: 177 10*3/uL (ref 150–400)
RBC: 3.14 MIL/uL — ABNORMAL LOW (ref 3.87–5.11)
RDW: 14.2 % (ref 11.5–15.5)
WBC: 12 10*3/uL — ABNORMAL HIGH (ref 4.0–10.5)
nRBC: 0 % (ref 0.0–0.2)

## 2018-08-19 MED ORDER — IBUPROFEN 800 MG PO TABS
800.0000 mg | ORAL_TABLET | Freq: Four times a day (QID) | ORAL | Status: DC
  Administered 2018-08-19 – 2018-08-20 (×5): 800 mg via ORAL
  Filled 2018-08-19 (×5): qty 1

## 2018-08-19 NOTE — Lactation Note (Signed)
This note was copied from a baby's chart. Lactation Consultation Note Staff Nurse paged to assist mother with latching infant.  P1, infant is 28 hours old. Infant has had only one breastfeeding attempt.  Infant has been exclusively bottle feeding.   He had formula at 4:45 this am. Infant  swaddled well in crib and asleep when I arrived in the room.  Infant placed STS with Mother. Attempt to latch infant in cross cradle hold. Infant opened his mouth around mothers nipple for a few shallow sucks. No sustained latch.  Attempt to rouse infant with body massage .   Assist mother with hand expression on both breast. Unable to express drops of colostrum.   Infant placed in football hold and observed a few tugs. No sustained latch. Infant sleepy and showing no feeding cues at this time.    Suggested that mother and father do frequent STS and watch for feeding cues. Mother has formula at the bedside. Suggested that mother wait for feeding cues and then attempt to latch and if no breastfeeding the next feeding attempt. Mother to offer formula. Mother to page staff nurse or LC to see latch.   Lots of teaching with mother. Advised to attempt to feed with all feeding cues. Mother has a hand pump at the bedside. She was advised to pump for 15 mins on each breast. Reviewed hand expression and encouraged mother to do so frequently.  Staff nurse Era Bumpers to sat up DEBP and assist with pumping.  Mother to feed infant 8-12 times or more in 24 hours .  Mother is aware of available assistance.  Patient Name: Melissa Wilkins [id number] Date: 08/19/2018 Reason for consult: Follow-up assessment;1st time breastfeeding;Term;Maternal endocrine disorder;Other (Comment)(PPH) Type of Endocrine Disorder?: Diabetes   Maternal Data    Feeding Feeding Type: Breast Fed  LATCH Score                   Interventions Interventions: Breast feeding basics reviewed;Assisted with latch;Skin to skin;Hand  express;Adjust position;Support pillows;Position options;Hand pump(mother to page to check latch)  Lactation Tools Discussed/Used     Consult Status Consult Status: Follow-up Date: 08/19/18 Follow-up type: In-patient    [name] [facility] 08/19/2018, 9:35 AM

## 2018-08-19 NOTE — Progress Notes (Addendum)
POSTOPERATIVE DAY # 1 S/P Primary LTCS for intrapartum hemorrhage, baby boy    S:         Reports feeling well; c/o some incisional pain with Tylenol and declined Oxy IR for now. Requesting Motrin.              Tolerating po intake / no nausea / no vomiting / no flatus / no BM  Denies dizziness, SOB, or CP             Bleeding is moderate             Up ad lib / ambulatory/ voiding QS without difficulty   Newborn breast feeding with formula supplementing; has not seen colostrum yet   / Circumcision - planning   O:  VS: BP 106/71   Pulse 71   Temp 98.7 F (37.1 C) (Oral)   Resp 16   Ht 5\' 5"  (1.651 m)   Wt 86.2 kg   SpO2 100%   Breastfeeding Unknown   BMI 31.63 kg/m    LABS:               Recent Labs    08/18/18 0457 08/19/18 0415  WBC 8.2 12.0*  HGB 8.0* 9.8*  PLT 166 177               Bloodtype: --/--/A POS, A POS Performed at [facility], [address] [place], [place] [address]  [phone](07/17 0107)  Rubella: Immune (12/27 0000)                                             I&O: Intake/Output      07/17 0701 - 07/18 0700 07/18 0701 - 07/19 0700   I.V. (mL/kg) 1670.3 (19.4)    Blood 350    Total Intake(mL/kg) 2020.3 (23.4)    Urine (mL/kg/hr) 2025 (1) 550 (1.3)   Blood     Total Output 2025 550   Net -4.7 -550                     Physical Exam:             Alert and Oriented X3  Lungs: Clear and unlabored  Heart: regular rate and rhythm / no murmurs  Abdomen: soft, non-tender, mild gaseous distention, active bowel sounds in all quadrants              Fundus: firm, non-tender, U-1             Dressing: pressure dressing removed by me; honeycomb dressing with steri-strips c/d/i (small amount of brown drainage noted)              Incision:  approximated with sutures / no erythema / no ecchymosis / no drainage  Perineum: intact  Lochia: moderate amount of lochia rubra on pad   Extremities: no edema, no calf pain or tenderness,   A/P:     POD # 1 S/P Primary  LTCS for intrapartum hemorrhage             ABL Anemia r/t hemorrhage   - s/p 4 units PRBCs and 1 unit FFP   - on Oral FE daily    - stable, asymptomatic now   - Labs stable   - Okay to resume Motrin 800mg  every 6hrs  Routine postoperative care  Lactation support - education provided about breastfeeding timing; hand expression and massage encouraged  Warm liquids and ambulation; shower and k-pad to promote bowel motility   May shower today  Pt. Considering early d/c home tomorrow   Lars Pinks, MSN, CNM [facility]

## 2018-08-20 MED ORDER — ACETAMINOPHEN 325 MG PO TABS: 650.0000 mg | ORAL_TABLET | Freq: Four times a day (QID) | ORAL | Status: DC | PRN

## 2018-08-20 MED ORDER — COCONUT OIL OIL: 1.0000 "application " | TOPICAL_OIL | 0 refills | Status: DC | PRN

## 2018-08-20 MED ORDER — IBUPROFEN 800 MG PO TABS: 800.0000 mg | ORAL_TABLET | Freq: Four times a day (QID) | ORAL | 0 refills | Status: DC

## 2018-08-20 MED ORDER — OXYCODONE HCL 5 MG PO TABS: 5.0000 mg | ORAL_TABLET | Freq: Four times a day (QID) | ORAL | 0 refills | Status: AC | PRN

## 2018-08-20 NOTE — Progress Notes (Signed)
POSTOPERATIVE DAY # 2 S/P Primary LTCS for intrapartum hemorrhage, blood transfusion                                              baby boy    S:         Reports feeling well; pain well controlled with Motrin/ Tylenol. Bleeding minimal.   Newborn breast feeding with formula supplementing   / Circumcision -done   O:  VS: BP 113/72 (BP Location: Left Arm)   Pulse 84   Temp 98.1 F (36.7 C) (Oral)   Resp 18   Ht 5\' 5"  (1.651 m)   Wt 86.2 kg   SpO2 100%   Breastfeeding Unknown   BMI 31.63 kg/m    LABS:  CBC Latest Ref Rng & Units 08/19/2018 08/18/2018 08/18/2018  WBC 4.0 - 10.5 K/uL 12.0(H) 8.2 8.4  Hemoglobin 12.0 - 15.0 g/dL 9.8(L) 8.0(L) 12.6  Hematocrit 36.0 - 46.0 % 28.5(L) 23.7(L) 37.1  Platelets 150 - 400 K/uL 177 166 176                     Bloodtype: --/--/A POS, A POS Performed at [facility], [place] [address] [place], [place] [address]  [phone])  Rubella: Immune (12/27 0000)                                  Physical Exam:             Alert and Oriented X3  Lungs: Clear and unlabored  Heart: regular rate and rhythm / no murmurs  Abdomen: soft, non-tender, mild gaseous distention, active bowel sounds in all quadrants              Fundus: firm, non-tender, U-2             Incision:  approximated with sutures / no erythema / no ecchymosis / no drainage . Dressing dry  Perineum: intact  Lochia: moderate amount of lochia rubra on pad   Extremities: no edema, no calf pain or tenderness,   A/P:     POD # 2 S/P Primary LTCS for intrapartum hemorrhage             ABL Anemia due to intrapartum acute hemorrhage   - s/p 4 units PRBCs and 1 unit FFP   - on Oral FE daily    - stable, asymptomatic now   - Labs stable     Routine postoperative care              Lactation support - education provided about breastfeeding timing; hand expression and massage encouraged   Discharge home, post-op and PP care and warning s/s reviewed.  F/up office 6 weeks   V.Benjie Karvonen,  MD

## 2018-08-20 NOTE — Discharge Summary (Signed)
Obstetric Discharge Summary Reason for Admission: 39.1 wks, IOL for A2GDM on Metformin.  --A2GDM. Poor home BS testing compliance but improved at 34 wks. Metformin 500mg  PO BID.  --Placenta previa resolved to low placenta at 32 wks, resolved to posterior placenta not low at 34 wks.  --GBS+ --PCOS, Femara conception --SS trait  --Prior h/o fetal loss from chronic bleeding, anhydramnios at 23 wks  Prenatal Procedures:  Anatomy sono, serial growth sono from 28 wks, q 4 wks, ANtesting with NST, BPP from 34 wks   Intrapartum Procedures: Patient was overnight admission for Cytotec, received one dose of 25 mcg vaginally. GBS prophylaxis with PCN started. She was 5 cm dilated when started to have abrupt vaginal bleeding, she was remote from delivery, so emergency LTCS was performed.    Complications-Operative and Postpartum: hemorrhage. Intrapartum was 1750 cc blood loss within 15-20 min of initial bleeding, intra-op blood loss at C/section was 650 cc, total about 2500 cc   Postpartum Procedures: transfusion of 4 units pRBCs and 1 FFP in PACU   CBC Latest Ref Rng & Units 08/19/2018 08/18/2018 08/18/2018  WBC 4.0 - 10.5 K/uL 12.0(H) 8.2 8.4  Hemoglobin 12.0 - 15.0 g/dL 9.8(L) 8.0(L) 12.6  Hematocrit 36.0 - 46.0 % 28.5(L) 23.7(L) 37.1  Platelets 150 - 400 K/uL 177 166 176    Physical Exam:  BP 113/72 (BP Location: Left Arm)   Pulse 84   Temp 98.1 F (36.7 C) (Oral)   Resp 18   Ht 5\' 5"  (1.651 m)   Wt 86.2 kg   SpO2 100%   Breastfeeding Unknown   BMI 31.63 kg/m   General: alert and cooperative Lochia: appropriate Lungs CTA bil CV RRR I/O adequate  Uterine Fundus: firm Incision: healing well, dressing dry DVT Evaluation: No evidence of DVT seen on physical exam.  Discharge Diagnoses: Term Pregnancy-delivered. Severe intrapartum hemorrhage, noted to be from posterior lower segment needing oversewing of bleeding   Discharge Information: Date: 08/20/2018 Activity: pelvic rest Diet:  routine Medications: PNV, Ibuprofen, Percocet and Iron Condition: stable and improved Instructions: refer to practice specific booklet Discharge to: home   Newborn Data: Live born female  Birth Weight: 9 lb 6.6 oz (4269 g) APGAR: 42, 10  Newborn Delivery   Birth date/time: 08/18/2017 04:47:00 Delivery type: C-Section, Low Transverse Trial of labor: No C-section categorization: Primary      Home with mother.  Elveria Royals 08/20/2018, 11:25 AM

## 2018-08-20 NOTE — Discharge Instructions (Signed)

## 2018-08-21 LAB — TYPE AND SCREEN
ABO/RH(D): A POS
Antibody Screen: NEGATIVE
Unit division: 0
Unit division: 0
Unit division: 0
Unit division: 0
Unit division: 0
Unit division: 0
Unit division: 0
Unit division: 0
Unit division: 0
Unit division: 0

## 2018-08-21 LAB — BPAM RBC
Blood Product Expiration Date: 202008062359
Blood Product Expiration Date: 202008072359
Blood Product Expiration Date: 202008082359
Blood Product Expiration Date: 202008082359
Blood Product Expiration Date: 202008082359
Blood Product Expiration Date: 202008082359
Blood Product Expiration Date: 202008102359
Blood Product Expiration Date: 202008102359
Blood Product Expiration Date: 202008112359
Blood Product Expiration Date: 202008112359
ISSUE DATE / TIME: 202007170439
ISSUE DATE / TIME: 202007170439
ISSUE DATE / TIME: 202007170439
ISSUE DATE / TIME: 202007181226
ISSUE DATE / TIME: 202007190336
ISSUE DATE / TIME: 202007190636
Unit Type and Rh: 6200
Unit Type and Rh: 6200
Unit Type and Rh: 6200
Unit Type and Rh: 6200
Unit Type and Rh: 6200
Unit Type and Rh: 6200
Unit Type and Rh: 6200
Unit Type and Rh: 6200
Unit Type and Rh: 6200
Unit Type and Rh: 6200

## 2019-05-01 ENCOUNTER — Ambulatory Visit: Payer: BC Managed Care – PPO | Admitting: General Surgery

## 2019-05-01 ENCOUNTER — Ambulatory Visit: Payer: BLUE CROSS/BLUE SHIELD | Admitting: General Surgery

## 2019-05-01 ENCOUNTER — Other Ambulatory Visit: Payer: Self-pay

## 2019-05-01 ENCOUNTER — Encounter: Payer: Self-pay | Admitting: General Surgery

## 2019-05-01 VITALS — BP 135/93 | HR 73 | Temp 97.7°F | Ht 65.0 in | Wt 173.8 lb

## 2019-05-01 DIAGNOSIS — M6208 Separation of muscle (nontraumatic), other site: Secondary | ICD-10-CM

## 2019-05-01 NOTE — Patient Instructions (Addendum)
Diastasis Recti  Diastasis recti is when the muscles of the abdomen (rectus abdominis muscles) become thin and separate. The result is a wider space between the right and left abdomen (abdominal) muscles. This wider space between the muscles may cause a bulge in the middle of your abdomen. You may notice this bulge when you are straining or when you sit up from a lying down position. Diastasis recti can affect men and women. It is most common among pregnant women, infants, people who are obese, and people who have had abdominal surgery. Exercise or surgical treatment may help correct it. What are the causes? Common causes of this condition include:  Pregnancy. The growing uterus puts pressure on the abdominal muscles, which causes the muscles to separate.  Obesity. Excess fat puts pressure on abdominal muscles.  Weightlifting.  Some abdomen exercises.  Advanced age.  Genetics.  Prior abdominal surgery. What increases the risk? This condition is more likely to develop in:  Women.  Newborns, especially newborns who are born early (prematurely). What are the signs or symptoms? Common symptoms of this condition include:  A bulge in the middle of the abdomen. You will notice it most when you sit up or strain.  Pain in the low back, pelvis, or hips.  Constipation.  Inability to control when you urinate (urinary incontinence).  Bloating.  Poor posture. How is this diagnosed? This condition is diagnosed with a physical exam. Your health care provider will ask you to lie flat on your back and do a crunch or half sit-up. If you have diastasis recti, a vertical bulge will appear between your abdominal muscles in the center of your abdomen. Your health care provider will measure the gap between your muscles with one of the following:  A medical device used to measure the space between two objects (caliper).  A tape measure.  CT scan.  Ultrasound.  Finger spaces. Your health  care provider will measure the space using their fingers. How is this treated? If your muscle separation is not too large, you may not need treatment. However, if you are a woman who plans to become pregnant again, you should treat this condition before your next pregnancy. Treatment may include:  Physical therapy to strengthen and tighten your abdominal muscles.  Lifestyle changes such as weight loss and exercise.  Over-the-counter pain medicines as needed.  Surgery to correct the separation. Follow these instructions at home: Activity  Return to your normal activities as told by your health care provider. Ask your health care provider what activities are safe for you.  When lifting weights or doing exercises using your abdominal muscles or the muscles in the center of your body that give stability (core muscles), make sure you are doing your exercises and movements correctly. Proper form can help to prevent the condition from happening again. General instructions  If you are overweight, ask your health care provider for help with weight loss. Losing even a small amount of weight can help to improve your diastasis recti.  Take over-the-counter or prescription medicines only as told by your health care provider.  Do not strain. Straining can make the separation worse. Examples of straining include: ? Pushing hard to have a bowel movement, such as due to constipation. ? Lifting heavy objects, including children. ? Standing up and sitting down.  Take steps to prevent constipation: ? Drink enough fluid to keep your urine clear or pale yellow. ? Take over-the-counter or prescription medicines only as directed. ? Eat foods   that are high in fiber, such as fresh fruits and vegetables, whole grains, and beans. ? Limit foods that are high in fat and processed sugars, such as fried and sweet foods. Contact a health care provider if:  You notice a new bulge in your abdomen. Get help right  away if:  You experience severe discomfort in your abdomen.  You develop severe abdominal pain along with nausea, vomiting, or fever. Summary  Diastasis recti is when the abdomen (abdominal) muscles become thin and separate. Your abdomen will stick out because the space between your right and left abdomen muscles has widened.  The most common symptom is a bulge in your abdomen. You will notice it most when you sit up or are straining.  This condition is diagnosed during a physical exam.  If the abdomen separation is not too big, you may choose not to have treatment. Otherwise, you may need to undergo physical therapy or surgery. This information is not intended to replace advice given to you by your health care provider. Make sure you discuss any questions you have with your health care provider. Document Revised: 12/31/2016 Document Reviewed: 03/15/2016 Elsevier Patient Education  2020 [employer]  

## 2019-05-01 NOTE — Progress Notes (Signed)
Patient ID: Melissa Wilkins, female   DOB: 03/31/1980, 39 y.o.   MRN: [id number]  Chief Complaint  Patient presents with  . New Patient (Initial Visit)    New pt ref Dr.Eugene Farrug Abdominal hernia    HPI Melissa Wilkins is a 39 y.o. female.   She has been referred for surgical evaluation of an abdominal hernia.  Melissa Wilkins states that she noticed a bulging in her abdomen during her recent pregnancy.  After the child was delivered, the bulging persisted.  She has never had any operative intervention on her abdomen, aside from a cesarean section and liposuction.  She denies any pain in the area.  No nausea or vomiting.  No symptoms of obstruction, such as obstipation or constipation.  She only notices the bulging if she tenses her abdominal wall muscles, otherwise it is flat.  She says that she did an Therapist, art and she thinks she may actually have "separation of the abdominal muscles."   Past Medical History:  Diagnosis Date  . Gestational diabetes   . Trichomonas vaginitis   . Vaginal Pap smear, abnormal    biopsy     Past Surgical History:  Procedure Laterality Date  . CESAREAN SECTION N/A 08/18/2018   Procedure: CESAREAN SECTION;  Surgeon: Shea Evans, MD;  Location: [facility];  Service: Obstetrics;  Laterality: N/A;  . NO PAST SURGERIES      Family History  Problem Relation Age of Onset  . Diabetes Maternal Aunt   . Hyperlipidemia Maternal Aunt   . Diabetes Maternal Grandmother   . Hyperlipidemia Maternal Grandmother   . Cancer Maternal Grandfather     Social History Social History   Tobacco Use  . Smoking status: Never Smoker  . Smokeless tobacco: Never Used  Substance Use Topics  . Alcohol use: Yes    Comment: social drinker  . Drug use: No    No Known Allergies  Current Outpatient Medications  Medication Sig Dispense Refill  . acetaminophen (TYLENOL) 325 MG tablet Take 2 tablets (650 mg total) by mouth every 6 (six) hours as needed for mild  pain (temperature > 101.5.). (Patient not taking: Reported on 05/01/2019)    . Ascorbic Acid (VITAMIN C GUMMIE PO) Take 2 each by mouth daily.    . cholecalciferol (VITAMIN D3) 25 MCG (1000 UT) tablet Take 1,000 Units by mouth daily.    . coconut oil OIL Apply 1 application topically as needed. (Patient not taking: Reported on 05/01/2019)  0  . ibuprofen (ADVIL) 800 MG tablet Take 1 tablet (800 mg total) by mouth every 6 (six) hours. (Patient not taking: Reported on 05/01/2019) 30 tablet 0  . metFORMIN (GLUCOPHAGE) 500 MG tablet Take by mouth 2 (two) times a day.    . Prenatal Vit-Fe Fumarate-FA (PRENATAL MULTIVITAMIN) TABS tablet Take 1 tablet by mouth daily at 12 noon.     No current facility-administered medications for this visit.    Review of Systems Review of Systems  All other systems reviewed and are negative.   Blood pressure (!) 135/93, pulse 73, temperature 97.7 F (36.5 C), temperature source Temporal, height 5\' 5"  (1.651 m), weight 173 lb 12.8 oz (78.8 kg), SpO2 99 %, unknown if currently breastfeeding. Body mass index is 28.92 kg/m.  Physical Exam Physical Exam Constitutional:      General: She is not in acute distress.    Appearance: Normal appearance.  HENT:     Head: Normocephalic and atraumatic.     Nose:  Comments: Covered with a mask secondary to COVID-19 precautions    Mouth/Throat:     Comments: Covered with a mask secondary to COVID-19 precautions Eyes:     General: No scleral icterus.       Right eye: No discharge.        Left eye: No discharge.     Conjunctiva/sclera: Conjunctivae normal.  Neck:     Comments: No thyromegaly or dominant thyroid masses appreciated. Cardiovascular:     Rate and Rhythm: Normal rate and regular rhythm.     Pulses: Normal pulses.     Heart sounds: No murmur.  Pulmonary:     Effort: Pulmonary effort is normal. No respiratory distress.     Breath sounds: Normal breath sounds.  Abdominal:     General: Abdomen is flat.  Bowel sounds are normal.     Palpations: Abdomen is soft.     Tenderness: There is no abdominal tenderness.     Hernia: No hernia is present.       Comments: Diastasis rectus is present with Valsalva.  No fascial defect appreciated.  Genitourinary:    Comments: Deferred Musculoskeletal:        General: No swelling, tenderness or deformity.     Cervical back: No rigidity.  Lymphadenopathy:     Cervical: No cervical adenopathy.  Skin:    General: Skin is warm and dry.  Neurological:     General: No focal deficit present.     Mental Status: She is alert and oriented to person, place, and time.  Psychiatric:        Mood and Affect: Mood normal.        Behavior: Behavior normal.     Data Reviewed Via the electronic medical record, I was able to review a clinic note from the referring provider, Sharyn Creamer, FNP.  No hernia is described on his abdominal examination.  He made a referral to general surgery, citing a "recurrent abdominal hernia without obstruction or gangrene."  No imaging was performed.  Assessment This is a 39 year old woman with an abdominal bulge that first repaired during pregnancy.  The patient has correctly diagnosed herself with diastasis rectus; no fascial defect was identified on exam.  She is completely asymptomatic.  Plan I provided the patient with reassurance that this is not anything that requires surgical intervention.  She asked whether or not the bulging would ever go away.  I told her that due to the laxity of the linea alba, most likely caused by her pregnancy, that the bulging would not go away on its own.  I told her that on occasion, we can plicate the area, however it is not generally recommended unless there are significant symptoms associated.  As she has none of these, I do not think operative intervention is warranted.  She is welcome to contact us in the future, if she becomes more symptomatic or wishes to pursue the idea of plication further.  We  will see her on an as-needed basis.    Fredirick Maudlin 05/01/2019, 12:29 PM

## 2019-12-20 ENCOUNTER — Ambulatory Visit: Payer: Self-pay

## 2019-12-20 NOTE — Telephone Encounter (Signed)
Pt. Reports she accidentally took 4 Augmentin tablets today instead of OTC Lysine. Poison Control Center contacted - [facility] [place].Mario instructed pt. To hydrate with water, Gatorade. Pt. Currently not having symptoms.Instructed to call poison control back if anything changes. Pt. Given number.Verbalizes understanding.

## 2020-08-01 ENCOUNTER — Other Ambulatory Visit: Payer: Self-pay

## 2020-08-01 ENCOUNTER — Emergency Department (HOSPITAL_BASED_OUTPATIENT_CLINIC_OR_DEPARTMENT_OTHER)
Admission: EM | Admit: 2020-08-01 | Discharge: 2020-08-01 | Disposition: A | Discharge status: Home / Self Care | Payer: Self-pay | Attending: Emergency Medicine | Admitting: Emergency Medicine

## 2020-08-01 DIAGNOSIS — R17 Unspecified jaundice: Secondary | ICD-10-CM | POA: Insufficient documentation

## 2020-08-01 DIAGNOSIS — B9689 Other specified bacterial agents as the cause of diseases classified elsewhere: Secondary | ICD-10-CM | POA: Insufficient documentation

## 2020-08-01 DIAGNOSIS — N760 Acute vaginitis: Secondary | ICD-10-CM | POA: Insufficient documentation

## 2020-08-01 LAB — WET PREP, GENITAL
Sperm: NONE SEEN
Trich, Wet Prep: NONE SEEN
Yeast Wet Prep HPF POC: NONE SEEN

## 2020-08-01 LAB — URINALYSIS, ROUTINE W REFLEX MICROSCOPIC
Bilirubin Urine: NEGATIVE
Glucose, UA: NEGATIVE mg/dL
Hgb urine dipstick: NEGATIVE
Ketones, ur: NEGATIVE mg/dL
Leukocytes,Ua: NEGATIVE
Nitrite: NEGATIVE
Protein, ur: NEGATIVE mg/dL
Specific Gravity, Urine: 1.025 (ref 1.005–1.030)
pH: 5.5 (ref 5.0–8.0)

## 2020-08-01 LAB — PREGNANCY, URINE: Preg Test, Ur: NEGATIVE

## 2020-08-01 MED ORDER — METRONIDAZOLE 500 MG PO TABS: 500.0000 mg | ORAL_TABLET | Freq: Two times a day (BID) | ORAL | 0 refills | Status: AC

## 2020-08-01 NOTE — ED Provider Notes (Signed)
[facility] EMERGENCY DEPARTMENT Provider Note   CSN: [id number] Arrival date & time: 08/01/20  1920     History Chief Complaint  Patient presents with   Vaginal Discharge    Melissa Wilkins is a 40 y.o. female.  The history is provided by the patient. No language interpreter was used.  Vaginal Discharge Quality:  White Severity:  Moderate Onset quality:  Gradual Timing:  Constant Progression:  Worsening Chronicity:  New Context: not recent antibiotic use   Relieved by:  Nothing Worsened by:  Nothing Ineffective treatments:  None tried Associated symptoms: no abdominal pain, no dysuria, no nausea and no vaginal itching   Pt complains of a vaginal discharge.  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/01/2019   Encounter for planned induction of labor 08/18/2018   Postpartum care following cesarean delivery (7/17) 08/18/2018   Delivery by emergency cesarean 08/18/2018   IUFD at 20 weeks or more of gestation 06/11/2017   Premature labor before [redacted] weeks gestation 06/10/2017   Low amniotic fluid 06/07/2017    Past Surgical History:  Procedure Laterality Date   CESAREAN SECTION N/A 08/18/2018   Procedure: CESAREAN SECTION;  Surgeon: Shea Evans, MD;  Location: [facility];  Service: Obstetrics;  Laterality: N/A;   NO PAST SURGERIES       OB History     Gravida  2   Para  2   Term  1   Preterm      AB      Living  1      SAB      IAB      Ectopic      Multiple  0   Live Births  1           Family History  Problem Relation Age of Onset   Diabetes Maternal Aunt    Hyperlipidemia Maternal Aunt    Diabetes Maternal Grandmother    Hyperlipidemia Maternal Grandmother    Cancer Maternal Grandfather     Social  History   Tobacco Use   Smoking status: Never   Smokeless tobacco: Never  Vaping Use   Vaping Use: Never used  Substance Use Topics   Alcohol use: Yes    Comment: social drinker   Drug use: No    Home Medications Prior to Admission medications   Medication Sig Start Date End Date Taking? Authorizing Provider  metroNIDAZOLE (FLAGYL) 500 MG tablet Take 1 tablet (500 mg total) by mouth 2 (two) times daily for 7 days. 08/01/20 08/08/20 Yes Elson Areas, PA-C  acetaminophen (TYLENOL) 325 MG tablet Take 2 tablets (650 mg total) by mouth every 6 (six) hours as needed for mild pain (temperature > 101.5.). Patient not taking: Reported on 05/01/2019 08/20/18   Shea Evans, MD  Ascorbic Acid (VITAMIN C GUMMIE PO) Take 2 each by mouth daily.    [provider]  cholecalciferol (VITAMIN D3) 25 MCG (1000 UT) tablet Take 1,000 Units by mouth daily.    [provider]  coconut oil OIL Apply 1 application topically as needed. Patient not taking: Reported on 05/01/2019 08/20/18   Shea Evans, MD  ibuprofen (ADVIL) 800 MG tablet Take 1 tablet (800 mg  total) by mouth every 6 (six) hours. Patient not taking: Reported on 05/01/2019 08/20/18   Shea Evans, MD  metFORMIN (GLUCOPHAGE) 500 MG tablet Take by mouth 2 (two) times a day. 07/31/18   [provider]  Prenatal Vit-Fe Fumarate-FA (PRENATAL MULTIVITAMIN) TABS tablet Take 1 tablet by mouth daily at 12 noon.    [provider]    Allergies    Patient has no known allergies.  Review of Systems   Review of Systems  Gastrointestinal:  Negative for abdominal pain and nausea.  Genitourinary:  Positive for vaginal discharge. Negative for dysuria.  All other systems reviewed and are negative.  Physical Exam Updated Vital Signs BP (!) 132/92 (BP Location: Right Arm)   Pulse 82   Temp 98.6 F (37 C) (Oral)   Resp 18   Ht 5\' 5"  (1.651 m)   Wt 77.1 kg   LMP 07/27/2020   SpO2 100%   BMI 28.29 kg/m   Physical  Exam Vitals reviewed.  Constitutional:      Appearance: Normal appearance.  HENT:     Head: Normocephalic.     Mouth/Throat:     Mouth: Mucous membranes are moist.  Cardiovascular:     Rate and Rhythm: Normal rate.  Pulmonary:     Effort: Pulmonary effort is normal.  Abdominal:     General: Abdomen is flat.  Genitourinary:    Vagina: Vaginal discharge present.  Musculoskeletal:        General: Normal range of motion.  Skin:    Coloration: Skin is jaundiced.  Neurological:     General: No focal deficit present.     Mental Status: She is alert.  Psychiatric:        Mood and Affect: Mood normal.    ED Results / Procedures / Treatments   Labs (all labs ordered are listed, but only abnormal results are displayed) Labs Reviewed  WET PREP, GENITAL - Abnormal; Notable for the following components:      Result Value   Clue Cells Wet Prep HPF POC PRESENT (*)    WBC, Wet Prep HPF POC MANY (*)    All other components within normal limits  PREGNANCY, URINE  URINALYSIS, ROUTINE W REFLEX MICROSCOPIC  GC/CHLAMYDIA PROBE AMP ([place]) NOT AT [facility]    EKG None  Radiology No results found.  Procedures Procedures   Medications Ordered in ED Medications - No data to display  ED Course  I have reviewed the triage vital signs and the nursing notes.  Pertinent labs & imaging results that were available during my care of the patient were reviewed by me and considered in my medical decision making (see chart for details).    MDM Rules/Calculators/A&P                         MDM:  Pt given rx for flagyl.  Wet prep shows clue cells.   Final Clinical Impression(s) / ED Diagnoses Final diagnoses:  BV (bacterial vaginosis)    Rx / DC Orders ED Discharge Orders          Ordered    metroNIDAZOLE (FLAGYL) 500 MG tablet  2 times daily        08/01/20 2143          An After Visit Summary was printed and given to the patient.    2144, Elson Areas 08/01/20 2214     2215, DO 08/01/20 2342

## 2020-08-01 NOTE — Discharge Instructions (Addendum)
Return if any problems.

## 2020-08-01 NOTE — ED Triage Notes (Addendum)
Pt c/o vaginal discharge since yesterday similar to previous BV. Denies abdominal pain/urinary symptoms. Denies exposure or concern for STD

## 2020-08-05 LAB — GC/CHLAMYDIA PROBE AMP (~~LOC~~) NOT AT ARMC
Chlamydia: NEGATIVE
Comment: NEGATIVE
Comment: NORMAL
Neisseria Gonorrhea: NEGATIVE

## 2020-10-29 ENCOUNTER — Encounter: Payer: Self-pay | Admitting: General Surgery

## 2021-09-24 ENCOUNTER — Encounter (HOSPITAL_BASED_OUTPATIENT_CLINIC_OR_DEPARTMENT_OTHER): Payer: Self-pay

## 2021-09-24 ENCOUNTER — Emergency Department (HOSPITAL_BASED_OUTPATIENT_CLINIC_OR_DEPARTMENT_OTHER)
Admission: EM | Admit: 2021-09-24 | Discharge: 2021-09-24 | Disposition: A | Discharge status: Home / Self Care | Payer: BC Managed Care – PPO | Attending: Emergency Medicine | Admitting: Emergency Medicine

## 2021-09-24 ENCOUNTER — Other Ambulatory Visit: Payer: Self-pay

## 2021-09-24 DIAGNOSIS — M545 Low back pain, unspecified: Secondary | ICD-10-CM | POA: Diagnosis present

## 2021-09-24 DIAGNOSIS — M5459 Other low back pain: Secondary | ICD-10-CM | POA: Diagnosis not present

## 2021-09-24 MED ORDER — IBUPROFEN 600 MG PO TABS: 600.0000 mg | ORAL_TABLET | Freq: Four times a day (QID) | ORAL | 0 refills | Status: DC | PRN

## 2021-09-24 MED ORDER — LIDOCAINE 5 % EX PTCH
1.0000 | MEDICATED_PATCH | CUTANEOUS | Status: DC
  Administered 2021-09-24: 1 via TRANSDERMAL
  Filled 2021-09-24: qty 1

## 2021-09-24 MED ORDER — LIDOCAINE 5 % EX PTCH: 1.0000 | MEDICATED_PATCH | CUTANEOUS | 0 refills | Status: DC

## 2021-09-24 MED ORDER — PREDNISONE 10 MG PO TABS: 20.0000 mg | ORAL_TABLET | Freq: Every day | ORAL | 0 refills | Status: DC

## 2021-09-24 MED ORDER — IBUPROFEN 400 MG PO TABS
600.0000 mg | ORAL_TABLET | Freq: Once | ORAL | Status: AC
  Administered 2021-09-24: 600 mg via ORAL
  Filled 2021-09-24: qty 1

## 2021-09-26 ENCOUNTER — Emergency Department (HOSPITAL_BASED_OUTPATIENT_CLINIC_OR_DEPARTMENT_OTHER)
Admission: EM | Admit: 2021-09-26 | Discharge: 2021-09-26 | Disposition: A | Discharge status: Home / Self Care | Payer: BC Managed Care – PPO | Attending: Emergency Medicine | Admitting: Emergency Medicine

## 2021-09-26 ENCOUNTER — Encounter (HOSPITAL_BASED_OUTPATIENT_CLINIC_OR_DEPARTMENT_OTHER): Payer: Self-pay | Admitting: Emergency Medicine

## 2021-09-26 DIAGNOSIS — M79662 Pain in left lower leg: Secondary | ICD-10-CM | POA: Diagnosis present

## 2021-09-26 DIAGNOSIS — Z79899 Other long term (current) drug therapy: Secondary | ICD-10-CM | POA: Diagnosis not present

## 2021-09-26 DIAGNOSIS — M5410 Radiculopathy, site unspecified: Secondary | ICD-10-CM

## 2021-09-26 DIAGNOSIS — M79605 Pain in left leg: Secondary | ICD-10-CM | POA: Diagnosis not present

## 2021-09-26 DIAGNOSIS — M5416 Radiculopathy, lumbar region: Secondary | ICD-10-CM | POA: Diagnosis not present

## 2021-09-26 NOTE — ED Triage Notes (Signed)
Pt returns for continued LLE pain; sts pain is mostly in anterior thigh

## 2021-09-26 NOTE — Discharge Instructions (Signed)
Continue the prednisone until finished.  Recommend resting off your feet is much as possible.  Can supplement an extra strength Tylenol with the prednisone.  Once finished with the prednisone you can start the Motrin 800 mg every 8 hours.  Call sports medicine for follow-up on Monday.  Work note provided to take out of work for all of next week.

## 2021-09-26 NOTE — ED Provider Notes (Signed)
[facility] EMERGENCY DEPARTMENT Provider Note   CSN: [id number] Arrival date & time: 09/26/21  2057     History  Chief Complaint  Patient presents with   Leg Pain    Melissa Wilkins is a 41 y.o. female.  With onset of a burning sensation to the left thigh area.  Anteriorly on the thigh.  No posterior pain no true back pain.  But it is a burning sensation.  No weakness or numbness to the left foot.  No incontinence.  No trouble with the right lower extremity.  Patient without any similar symptoms in the past and no fall or injury.  But it is definitely made worse by walking.  Symptoms started on Tuesday.  Patient seen in the emergency department for that on August 24.  And treated with prednisone.  Patient states symptoms did come to start in the left lower back area.  But the pain does radiate into the anterior part of the thigh as mentioned.  Has no posterior leg pain at all.  Patient without any injury.  Patient is never had symptoms like this before.       Home Medications Prior to Admission medications   Medication Sig Start Date End Date Taking? Authorizing Provider  acetaminophen (TYLENOL) 325 MG tablet Take 2 tablets (650 mg total) by mouth every 6 (six) hours as needed for mild pain (temperature > 101.5.). Patient not taking: Reported on 05/01/2019 08/20/18   Shea Evans, MD  Ascorbic Acid (VITAMIN C GUMMIE PO) Take 2 each by mouth daily.    [provider]  cholecalciferol (VITAMIN D3) 25 MCG (1000 UT) tablet Take 1,000 Units by mouth daily.    [provider]  coconut oil OIL Apply 1 application topically as needed. Patient not taking: Reported on 05/01/2019 08/20/18   Shea Evans, MD  ibuprofen (ADVIL) 800 MG tablet Take 1 tablet (800 mg total) by mouth every 6 (six) hours. Patient not taking: Reported on 05/01/2019 08/20/18   Shea Evans, MD  metFORMIN (GLUCOPHAGE) 500 MG tablet Take by mouth 2 (two) times a day. 07/31/18   [provider]  Prenatal Vit-Fe Fumarate-FA (PRENATAL MULTIVITAMIN) TABS tablet Take 1 tablet by mouth daily at 12 noon.    [provider]      Allergies    Patient has no known allergies.    Review of Systems   Review of Systems  Constitutional:  Negative for chills and fever.  HENT:  Negative for ear pain and sore throat.   Eyes:  Negative for pain and visual disturbance.  Respiratory:  Negative for cough and shortness of breath.   Cardiovascular:  Negative for chest pain and palpitations.  Gastrointestinal:  Negative for abdominal pain and vomiting.  Genitourinary:  Negative for dysuria and hematuria.  Musculoskeletal:  Positive for back pain. Negative for arthralgias.  Skin:  Negative for color change and rash.  Neurological:  Negative for seizures and syncope.  All other systems reviewed and are negative.   Physical Exam Updated Vital Signs BP (!) 156/138   Pulse 80   Temp 98.5 F (36.9 C) (Oral)   Resp 16   Ht 1.651 m (5\' 5" )   Wt 79.4 kg   LMP 09/21/2021   SpO2 99%   BMI 29.12 kg/m  Physical Exam Vitals and nursing note reviewed.  Constitutional:      General: She is not in acute distress.    Appearance: Normal appearance. She is well-developed.  HENT:  Head: Normocephalic and atraumatic.  Eyes:     Extraocular Movements: Extraocular movements intact.     Conjunctiva/sclera: Conjunctivae normal.     Pupils: Pupils are equal, round, and reactive to light.  Cardiovascular:     Rate and Rhythm: Normal rate and regular rhythm.     Heart sounds: No murmur heard. Pulmonary:     Effort: Pulmonary effort is normal. No respiratory distress.     Breath sounds: Normal breath sounds.  Abdominal:     Palpations: Abdomen is soft.     Tenderness: There is no abdominal tenderness.  Musculoskeletal:        General: No swelling.     Cervical back: Neck supple.     Comments: No tenderness to palpation to the lumbar back area at this time.  Patient states  actually that that is improved on the prednisone.  Still has some discomfort with range of motion of the left leg.  But no decrease sensation or weakness to the anterior quadriceps or thigh area.  Or to the foot distally.  Neurovascularly intact.  Skin:    General: Skin is warm and dry.     Capillary Refill: Capillary refill takes less than 2 seconds.  Neurological:     General: No focal deficit present.     Mental Status: She is alert and oriented to person, place, and time.     Sensory: No sensory deficit.     Motor: No weakness.  Psychiatric:        Mood and Affect: Mood normal.     ED Results / Procedures / Treatments   Labs (all labs ordered are listed, but only abnormal results are displayed) Labs Reviewed - No data to display  EKG None  Radiology No results found.  Procedures Procedures    Medications Ordered in ED Medications - No data to display  ED Course/ Medical Decision Making/ A&P                           Medical Decision Making  Symptoms consistent with a lumbar radiculopathy more of the femoral nerve distribution since his anterior thigh.  Patient states the back pain is improving on the prednisone.  Patient will need extension of her work note will give referral to sports medicine.  Patient still taking the prednisone.  Recommend that she add on extra strength Tylenol to her regimen.  And when she finishes the prednisone she can go back to taking Motrin 800 mg every 8 hours along with the extra strength Tylenol.  Recommending rest and off her feet is much as possible.   Final Clinical Impression(s) / ED Diagnoses Final diagnoses:  Radicular pain of left lower extremity    Rx / DC Orders ED Discharge Orders     None         Vanetta Mulders, MD 09/26/21 2139

## 2021-09-28 ENCOUNTER — Encounter (HOSPITAL_BASED_OUTPATIENT_CLINIC_OR_DEPARTMENT_OTHER): Payer: Self-pay | Admitting: Emergency Medicine

## 2022-03-18 DIAGNOSIS — E669 Obesity, unspecified: Secondary | ICD-10-CM | POA: Diagnosis not present

## 2022-03-18 DIAGNOSIS — G43109 Migraine with aura, not intractable, without status migrainosus: Secondary | ICD-10-CM | POA: Diagnosis not present

## 2022-03-18 DIAGNOSIS — L259 Unspecified contact dermatitis, unspecified cause: Secondary | ICD-10-CM | POA: Diagnosis not present

## 2022-03-18 DIAGNOSIS — R030 Elevated blood-pressure reading, without diagnosis of hypertension: Secondary | ICD-10-CM | POA: Diagnosis not present

## 2022-04-01 DIAGNOSIS — L259 Unspecified contact dermatitis, unspecified cause: Secondary | ICD-10-CM | POA: Diagnosis not present

## 2022-04-01 DIAGNOSIS — Z7689 Persons encountering health services in other specified circumstances: Secondary | ICD-10-CM | POA: Diagnosis not present

## 2022-04-01 DIAGNOSIS — G43109 Migraine with aura, not intractable, without status migrainosus: Secondary | ICD-10-CM | POA: Diagnosis not present

## 2022-04-01 DIAGNOSIS — R030 Elevated blood-pressure reading, without diagnosis of hypertension: Secondary | ICD-10-CM | POA: Diagnosis not present

## 2022-08-20 DIAGNOSIS — G43009 Migraine without aura, not intractable, without status migrainosus: Secondary | ICD-10-CM | POA: Diagnosis not present

## 2022-08-20 DIAGNOSIS — Z6830 Body mass index (BMI) 30.0-30.9, adult: Secondary | ICD-10-CM | POA: Diagnosis not present

## 2022-08-20 DIAGNOSIS — I10 Essential (primary) hypertension: Secondary | ICD-10-CM | POA: Diagnosis not present

## 2022-08-20 DIAGNOSIS — E6609 Other obesity due to excess calories: Secondary | ICD-10-CM | POA: Diagnosis not present

## 2023-03-04 DIAGNOSIS — I10 Essential (primary) hypertension: Secondary | ICD-10-CM | POA: Diagnosis not present

## 2023-03-04 DIAGNOSIS — Z6830 Body mass index (BMI) 30.0-30.9, adult: Secondary | ICD-10-CM | POA: Diagnosis not present

## 2023-03-04 DIAGNOSIS — G43009 Migraine without aura, not intractable, without status migrainosus: Secondary | ICD-10-CM | POA: Diagnosis not present

## 2023-03-04 DIAGNOSIS — E6609 Other obesity due to excess calories: Secondary | ICD-10-CM | POA: Diagnosis not present

## 2023-07-06 DIAGNOSIS — Z1329 Encounter for screening for other suspected endocrine disorder: Secondary | ICD-10-CM | POA: Diagnosis not present

## 2023-07-06 DIAGNOSIS — Z130 Encounter for screening for diseases of the blood and blood-forming organs and certain disorders involving the immune mechanism: Secondary | ICD-10-CM | POA: Diagnosis not present

## 2023-07-06 DIAGNOSIS — Z131 Encounter for screening for diabetes mellitus: Secondary | ICD-10-CM | POA: Diagnosis not present

## 2023-07-06 DIAGNOSIS — G43009 Migraine without aura, not intractable, without status migrainosus: Secondary | ICD-10-CM | POA: Diagnosis not present

## 2023-07-06 DIAGNOSIS — Z0000 Encounter for general adult medical examination without abnormal findings: Secondary | ICD-10-CM | POA: Diagnosis not present

## 2023-07-06 DIAGNOSIS — Z1321 Encounter for screening for nutritional disorder: Secondary | ICD-10-CM | POA: Diagnosis not present

## 2023-07-06 DIAGNOSIS — Z13220 Encounter for screening for lipoid disorders: Secondary | ICD-10-CM | POA: Diagnosis not present

## 2023-10-04 ENCOUNTER — Telehealth: Payer: Self-pay | Admitting: General Surgery

## 2023-10-04 DIAGNOSIS — N939 Abnormal uterine and vaginal bleeding, unspecified: Secondary | ICD-10-CM | POA: Diagnosis not present

## 2023-10-04 DIAGNOSIS — M6208 Separation of muscle (nontraumatic), other site: Secondary | ICD-10-CM | POA: Diagnosis not present

## 2023-10-04 NOTE — Telephone Encounter (Signed)
 Pt has two medical record numbers. Said this is the one that has the information on it when was diag by Delon Elder. Her other medical record number that we need to use she says is [id number]. The [id number] is the new one that someone created.

## 2023-10-04 NOTE — Telephone Encounter (Signed)
 Pt called and said tht Melissa Wilkins told her to call us  to do a Diastasis of rectus abdominis . Do we do that here? PLease advise so that if we do we can get her an appt. Pt# is [phone]

## 2023-10-04 NOTE — Telephone Encounter (Signed)
 Pt has two medical record numbers. Said this is the one that has the information on it when was diag by Delon Elder. Her other medical record number that we need to use she says is

## 2023-10-19 ENCOUNTER — Ambulatory Visit: Admitting: Surgery

## 2023-10-25 ENCOUNTER — Ambulatory Visit
Admission: EM | Admit: 2023-10-25 | Discharge: 2023-10-25 | Disposition: A | Discharge status: Home / Self Care | Attending: Family Medicine | Admitting: Family Medicine

## 2023-10-25 DIAGNOSIS — N939 Abnormal uterine and vaginal bleeding, unspecified: Secondary | ICD-10-CM | POA: Diagnosis present

## 2023-10-25 DIAGNOSIS — R300 Dysuria: Secondary | ICD-10-CM | POA: Insufficient documentation

## 2023-10-25 LAB — POCT URINE DIPSTICK
Bilirubin, UA: NEGATIVE
Glucose, UA: NEGATIVE mg/dL
Ketones, POC UA: NEGATIVE mg/dL
Leukocytes, UA: NEGATIVE
Nitrite, UA: NEGATIVE
POC PROTEIN,UA: NEGATIVE
Spec Grav, UA: 1.02 (ref 1.010–1.025)
Urobilinogen, UA: 0.2 U/dL
pH, UA: 6 (ref 5.0–8.0)

## 2023-10-25 MED ORDER — MEGESTROL ACETATE 40 MG PO TABS: 80.0000 mg | ORAL_TABLET | Freq: Every day | ORAL | 0 refills | Status: DC

## 2023-10-25 NOTE — ED Provider Notes (Addendum)
 [facility]  Note:  This document was prepared using Conservation officer, historic buildings and may include unintentional dictation errors.  MRN: [id number] DOB: 1980/04/21  Subjective:   Melissa Wilkins is a 43 y.o. female presenting for 5-week history of persistent intermittent vaginal bleeding.  Patient was started on OCP and took it for 3 to 4 days before she had to stop due to significant side effects.  It did help with her vaginal bleeding but after she stopped it it started again.  Started bleeding heavily but continues to bleed heavily again.  Patient took 1 more pill of the contraception that she was prescribed on 10/20/2023.  This helped the bleeding some but is still persisting.  Developed dysuria today.  Denies fever, n/v, abdominal pain, pelvic pain, rashes, urinary frequency, hematuria, vaginal discharge.  No chance of pregnancy per patient.  No current facility-administered medications for this encounter.  Current Outpatient Medications:    acetaminophen  (TYLENOL ) 325 MG tablet, Take 2 tablets (650 mg total) by mouth every 6 (six) hours as needed for mild pain (temperature > 101.5.). (Patient not taking: Reported on 05/01/2019), Disp:  , Rfl:    Ascorbic Acid (VITAMIN C GUMMIE PO), Take 2 each by mouth daily., Disp: , Rfl:    cholecalciferol (VITAMIN D3) 25 MCG (1000 UT) tablet, Take 1,000 Units by mouth daily., Disp: , Rfl:    coconut oil OIL, Apply 1 application topically as needed. (Patient not taking: Reported on 05/01/2019), Disp:  , Rfl: 0   ibuprofen  (ADVIL ) 600 MG tablet, Take 1 tablet (600 mg total) by mouth every 6 (six) hours as needed., Disp: 30 tablet, Rfl: 0   ibuprofen  (ADVIL ) 800 MG tablet, Take 1 tablet (800 mg total) by mouth every 6 (six) hours. (Patient not taking: Reported on 05/01/2019), Disp: 30 tablet, Rfl: 0   lidocaine  (LIDODERM ) 5 %, Place 1 patch onto the skin daily. Remove & Discard patch within 12 hours or as directed by MD, Disp:  30 patch, Rfl: 0   metFORMIN (GLUCOPHAGE) 500 MG tablet, Take by mouth 2 (two) times a day., Disp: , Rfl:    predniSONE  (DELTASONE ) 10 MG tablet, Take 2 tablets (20 mg total) by mouth daily., Disp: 10 tablet, Rfl: 0   Prenatal Vit-Fe Fumarate-FA (PRENATAL MULTIVITAMIN) TABS tablet, Take 1 tablet by mouth daily at 12 noon., Disp: , Rfl:    No Known Allergies  Past Medical History:  Diagnosis Date   Gestational diabetes    Trichomonas vaginitis    Vaginal Pap smear, abnormal    biopsy      Past Surgical History:  Procedure Laterality Date   CESAREAN SECTION N/A 08/18/2018   Procedure: CESAREAN SECTION;  Surgeon: Barbette Knock, MD;  Location: [facility];  Service: Obstetrics;  Laterality: N/A;   NO PAST SURGERIES      Family History  Problem Relation Age of Onset   Diabetes Maternal Aunt    Hyperlipidemia Maternal Aunt    Diabetes Maternal Grandmother    Hyperlipidemia Maternal Grandmother    Cancer Maternal Grandfather     Social History   Tobacco Use   Smoking status: Never   Smokeless tobacco: Never  Vaping Use   Vaping status: Never Used  Substance Use Topics   Alcohol use: Not Currently    Comment: social drinker   Drug use: Never    ROS   Objective:   Vitals: BP (!) 143/89 (BP Location: Right Arm)   Pulse 96  Temp 99 F (37.2 C) (Oral)   Resp 16   LMP 09/19/2023 (Exact Date)   SpO2 97%   Breastfeeding No   Physical Exam Constitutional:      General: She is not in acute distress.    Appearance: Normal appearance. She is well-developed. She is not ill-appearing, toxic-appearing or diaphoretic.  HENT:     Head: Normocephalic and atraumatic.     Right Ear: External ear normal.     Left Ear: External ear normal.     Nose: Nose normal.     Mouth/Throat:     Mouth: Mucous membranes are moist.  Eyes:     General: No scleral icterus.       Right eye: No discharge.        Left eye: No discharge.     Extraocular Movements: Extraocular movements  intact.     Conjunctiva/sclera: Conjunctivae normal.  Cardiovascular:     Rate and Rhythm: Normal rate.  Pulmonary:     Effort: Pulmonary effort is normal.  Abdominal:     General: Bowel sounds are normal. There is no distension.     Palpations: Abdomen is soft. There is no mass.     Tenderness: There is no abdominal tenderness. There is no right CVA tenderness, left CVA tenderness, guarding or rebound.  Skin:    General: Skin is warm and dry.  Neurological:     General: No focal deficit present.     Mental Status: She is alert and oriented to person, place, and time.  Psychiatric:        Mood and Affect: Mood normal.        Behavior: Behavior normal.        Thought Content: Thought content normal.        Judgment: Judgment normal.    Results for orders placed or performed during the hospital encounter of 10/25/23 (from the past 24 hours)  POCT URINE DIPSTICK     Status: Abnormal   Collection Time: 10/25/23  4:55 PM  Result Value Ref Range   Color, UA yellow yellow   Clarity, UA hazy (A) clear   Glucose, UA negative negative mg/dL   Bilirubin, UA negative negative   Ketones, POC UA negative negative mg/dL   Spec Grav, UA 8.979 8.989 - 1.025   Blood, UA large (A) negative   pH, UA 6.0 5.0 - 8.0   POC PROTEIN,UA negative negative, trace   Urobilinogen, UA 0.2 0.2 or 1.0 E.U./dL   Nitrite, UA Negative Negative   Leukocytes, UA Negative Negative     Assessment and Plan :   PDMP not reviewed this encounter.  1. Abnormal vaginal bleeding   2. Dysuria    Offered patient Megace  to help with her significant vaginal bleeding.  Vaginal cytology and urine culture pending.  Hydrate well.  Keep follow-up appointment with her OB/GYN.  Given her significant difficulty with this oral contraception I did not recommend starting a new one.  Counseled patient on potential for adverse effects with medications prescribed/recommended today, ER and return-to-clinic precautions discussed,  patient verbalized understanding.   Christopher Savannah, [place] 10/25/23 [id number]

## 2023-10-25 NOTE — ED Triage Notes (Signed)
 Pt reports vaginal bleeding since 09/19/2023. Pt she was seeing by a provider at her PCP office couple weeks ago and was prescribed birth control to control the menstrual period, pt took it for 3-4 days an stopped as she felt tire, nauseous.

## 2023-10-26 ENCOUNTER — Ambulatory Visit (HOSPITAL_COMMUNITY): Payer: Self-pay

## 2023-10-26 LAB — URINE CULTURE: Culture: 20000 — AB

## 2023-10-26 LAB — CERVICOVAGINAL ANCILLARY ONLY
Bacterial Vaginitis (gardnerella): POSITIVE — AB
Chlamydia: NEGATIVE
Comment: NEGATIVE
Comment: NEGATIVE
Comment: NEGATIVE
Comment: NORMAL
Neisseria Gonorrhea: NEGATIVE
Trichomonas: NEGATIVE

## 2023-10-26 MED ORDER — METRONIDAZOLE 500 MG PO TABS: 500.0000 mg | ORAL_TABLET | Freq: Two times a day (BID) | ORAL | 0 refills | Status: AC

## 2023-10-27 ENCOUNTER — Encounter (HOSPITAL_BASED_OUTPATIENT_CLINIC_OR_DEPARTMENT_OTHER): Payer: Self-pay | Admitting: Certified Nurse Midwife

## 2023-10-27 ENCOUNTER — Ambulatory Visit (INDEPENDENT_AMBULATORY_CARE_PROVIDER_SITE_OTHER): Admitting: Certified Nurse Midwife

## 2023-10-27 ENCOUNTER — Other Ambulatory Visit (HOSPITAL_COMMUNITY)
Admission: RE | Admit: 2023-10-27 | Discharge: 2023-10-27 | Disposition: A | Source: Ambulatory Visit | Attending: Certified Nurse Midwife | Admitting: Certified Nurse Midwife

## 2023-10-27 VITALS — BP 143/91 | HR 86 | Ht 65.0 in | Wt 182.6 lb

## 2023-10-27 DIAGNOSIS — Z124 Encounter for screening for malignant neoplasm of cervix: Secondary | ICD-10-CM | POA: Diagnosis present

## 2023-10-27 DIAGNOSIS — N926 Irregular menstruation, unspecified: Secondary | ICD-10-CM | POA: Diagnosis not present

## 2023-10-27 DIAGNOSIS — Z113 Encounter for screening for infections with a predominantly sexual mode of transmission: Secondary | ICD-10-CM | POA: Insufficient documentation

## 2023-10-27 DIAGNOSIS — Z1231 Encounter for screening mammogram for malignant neoplasm of breast: Secondary | ICD-10-CM

## 2023-10-27 MED ORDER — NORETHINDRONE 0.35 MG PO TABS: 1.0000 | ORAL_TABLET | Freq: Every day | ORAL | 11 refills | Status: DC

## 2023-10-27 NOTE — Progress Notes (Signed)
 43 y.o. [id number] Single Black or [place] American female here for problem visit. Patient reports her periods are typically monthly and regular. Her last menstrual period started on 8/18. 2 weeks later on 10/04/23 she was still having bleeding and was evaluated. Pt states she was given combined oral contraceptive (norgestimate/estradiol .35) and she took it for 3 days. It caused such severe nausea that she could not tolerate it. Pt states she doesn't do well taking pills. She did stop bleeding for 7 days, but then it started again. Her last sexual activity was ~ May 2025.   TSH was normal 6/25. Pt verbalizes understanding/awareness that she was diagnosed with Type 2 DM 07/06/23. She is not checking blood sugars yet. States they recommended Metformin but she is not on medication. I encouraged the patient to please contact her Primary Care Provider regarding treatment needed for Diabetes.   BP elevated today (143/91) and pt aware. She states her blood pressure was also elevated 10/05/23 (143/89), but no history of hypertension prior to this time.    Patient's last menstrual period was 09/19/2023 (exact date).          Sexually active: Not currently  The current method of family planning is abstinence.     The pregnancy intention screening data noted above was reviewed. Potential methods of contraception were discussed. The patient elected to proceed with No data recorded.  Exercising: Yes.     Smoker:  no  Health Maintenance: Pap:  Pt states pap smear is due, unsure exact date of last pap smear History of abnormal Pap:  No known Hx abnormal pap MMG:  Pt has not had mammogram yet but willing to schedule (order placed) Screening Labs: UTD 07/2023   reports that she has never smoked. She has never used smokeless tobacco. She reports that she does not currently use alcohol. She reports that she does not use drugs.  Past Medical History:  Diagnosis Date   Gestational diabetes    Trichomonas vaginitis     Vaginal Pap smear, abnormal    biopsy     Past Surgical History:  Procedure Laterality Date   CESAREAN SECTION N/A 08/18/2018   Procedure: CESAREAN SECTION;  Surgeon: Barbette Knock, MD;  Location: [facility];  Service: Obstetrics;  Laterality: N/A;   NO PAST SURGERIES      Current Outpatient Medications  Medication Sig Dispense Refill   acetaminophen  (TYLENOL ) 325 MG tablet Take 2 tablets (650 mg total) by mouth every 6 (six) hours as needed for mild pain (temperature > 101.5.).     Ascorbic Acid (VITAMIN C GUMMIE PO) Take 2 each by mouth daily.     cholecalciferol (VITAMIN D3) 25 MCG (1000 UT) tablet Take 1,000 Units by mouth daily.     ibuprofen  (ADVIL ) 600 MG tablet Take 1 tablet (600 mg total) by mouth every 6 (six) hours as needed. 30 tablet 0   norethindrone  (MICRONOR ) 0.35 MG tablet Take 1 tablet (0.35 mg total) by mouth daily. 90 tablet 11   coconut oil OIL Apply 1 application topically as needed. (Patient not taking: Reported on 10/27/2023)  0   ibuprofen  (ADVIL ) 800 MG tablet Take 1 tablet (800 mg total) by mouth every 6 (six) hours. (Patient not taking: Reported on 05/01/2019) 30 tablet 0   lidocaine  (LIDODERM ) 5 % Place 1 patch onto the skin daily. Remove & Discard patch within 12 hours or as directed by MD (Patient not taking: Reported on 10/27/2023) 30 patch 0   megestrol  (MEGACE )  40 MG tablet Take 2 tablets (80 mg total) by mouth daily. (Patient not taking: Reported on 10/27/2023) 10 tablet 0   metFORMIN (GLUCOPHAGE) 500 MG tablet Take by mouth 2 (two) times a day. (Patient not taking: Reported on 10/27/2023)     metroNIDAZOLE  (FLAGYL ) 500 MG tablet Take 1 tablet (500 mg total) by mouth 2 (two) times daily for 7 days. (Patient not taking: Reported on 10/27/2023) 14 tablet 0   predniSONE  (DELTASONE ) 10 MG tablet Take 2 tablets (20 mg total) by mouth daily. (Patient not taking: Reported on 10/27/2023) 10 tablet 0   Prenatal Vit-Fe Fumarate-FA (PRENATAL MULTIVITAMIN) TABS tablet Take  1 tablet by mouth daily at 12 noon. (Patient not taking: Reported on 10/27/2023)     No current facility-administered medications for this visit.    Family History  Problem Relation Age of Onset   Diabetes Maternal Aunt    Hyperlipidemia Maternal Aunt    Diabetes Maternal Grandmother    Hyperlipidemia Maternal Grandmother    Cancer Maternal Grandfather     ROS: Constitutional: negative for chills and fevers Genitourinary:negative for dysuria and urinary incontinence  Exam:   BP (!) 143/91   Pulse 86   Ht 5' 5 (1.651 m) Comment: Reported  Wt 182 lb 9.6 oz (82.8 kg)   LMP 09/19/2023 (Exact Date)   BMI 30.39 kg/m   Height: 5' 5 (165.1 cm) (Reported)  General appearance: alert, cooperative and appears stated age   Pelvic: External genitalia:  no lesions              Urethra:  normal appearing urethra with no masses, tenderness or lesions              Bartholins and Skenes: normal                 Vagina: normal appearing vagina with normal color and no discharge, no lesions              Cervix: multiparous appearance, no bleeding following Pap, no cervical motion tenderness, and no lesions              Pap taken: Yes.   Bimanual Exam:  Uterus:  normal size, contour, position, consistency, mobility, non-tender               Anus:  normal sphincter tone, no lesions  Chaperone,  CMA, was present for exam.  Assessment/Plan:   1. Irregular menses - Hx regular normal monthly menstrual periods until 8/18 period started. - Discussed availabilty US  to evaluate/rule out fibroids or other uterine or ovarian abnormality - Discussed available options to help regular menstrual period if needed. Pt declines Depo Provera today. She is willing to try an oral progesterone only pill. - Rule out anemia  - CBC; Future - Beta hCG quant (ref lab); Future - US  PELVIC COMPLETE WITH TRANSVAGINAL; Future  2. Cervical cancer screening - Pt states pap smear due. She would like routine STD  screening on pap smear. - Cytology - PAP( [facility])  3. Encounter for screening mammogram for malignant neoplasm of breast (Primary) - MM 3D SCREENING MAMMOGRAM BILATERAL BREAST; Future  4. Screen for STD (sexually transmitted disease) - Cytology - PAP( [facility])   Pt agrees to contact Primary Care Provider regarding the Diabetes she was diagnosed with in June 2025 (no meds yet/not checking blood sugars). Orders sent for routine screening mammogram and Pelvic US  to evaluate uterus/ovaries. Pt asked if she is able to have FMLA papers  signed so that she can be out of work.  Patient aware this does not qualify for FMLA.  Arland MARLA Roller

## 2023-10-28 ENCOUNTER — Encounter (HOSPITAL_BASED_OUTPATIENT_CLINIC_OR_DEPARTMENT_OTHER): Payer: Self-pay | Admitting: Certified Nurse Midwife

## 2023-10-28 LAB — BETA HCG QUANT (REF LAB): hCG Quant: <1 m[IU]/mL

## 2023-10-28 LAB — CBC
Hematocrit: 28.4 % — ABNORMAL LOW (ref 34.0–46.6)
Hemoglobin: 9.1 g/dL — ABNORMAL LOW (ref 11.1–15.9)
MCH: 29.1 pg (ref 26.6–33.0)
MCHC: 32 g/dL (ref 31.5–35.7)
MCV: 91 fL (ref 79–97)
Platelets: 315 x10E3/uL (ref 150–450)
RBC: 3.13 x10E6/uL — ABNORMAL LOW (ref 3.77–5.28)
RDW: 13.6 % (ref 11.7–15.4)
WBC: 6.5 x10E3/uL (ref 3.4–10.8)

## 2023-10-31 ENCOUNTER — Encounter (HOSPITAL_BASED_OUTPATIENT_CLINIC_OR_DEPARTMENT_OTHER): Payer: Self-pay | Admitting: Certified Nurse Midwife

## 2023-11-02 LAB — CYTOLOGY - PAP
Adequacy: ABSENT
Chlamydia: NEGATIVE
Comment: NEGATIVE
Comment: NEGATIVE
Comment: NEGATIVE
Comment: NEGATIVE
Comment: NORMAL
Diagnosis: NEGATIVE
HSV1: NEGATIVE
HSV2: NEGATIVE
High risk HPV: NEGATIVE
Neisseria Gonorrhea: NEGATIVE
Trichomonas: NEGATIVE

## 2023-11-04 ENCOUNTER — Ambulatory Visit (HOSPITAL_BASED_OUTPATIENT_CLINIC_OR_DEPARTMENT_OTHER)

## 2023-11-07 ENCOUNTER — Ambulatory Visit (HOSPITAL_BASED_OUTPATIENT_CLINIC_OR_DEPARTMENT_OTHER): Payer: Self-pay | Admitting: Certified Nurse Midwife

## 2023-11-16 ENCOUNTER — Ambulatory Visit: Admitting: Surgery

## 2023-11-17 ENCOUNTER — Ambulatory Visit

## 2023-11-24 DIAGNOSIS — R5383 Other fatigue: Secondary | ICD-10-CM | POA: Diagnosis not present

## 2023-11-24 DIAGNOSIS — E119 Type 2 diabetes mellitus without complications: Secondary | ICD-10-CM | POA: Diagnosis not present

## 2023-11-24 DIAGNOSIS — E1169 Type 2 diabetes mellitus with other specified complication: Secondary | ICD-10-CM | POA: Diagnosis not present

## 2023-11-24 DIAGNOSIS — D649 Anemia, unspecified: Secondary | ICD-10-CM | POA: Diagnosis not present

## 2023-12-07 ENCOUNTER — Ambulatory Visit (INDEPENDENT_AMBULATORY_CARE_PROVIDER_SITE_OTHER): Admitting: Surgery

## 2023-12-07 ENCOUNTER — Encounter: Payer: Self-pay | Admitting: Surgery

## 2023-12-07 VITALS — BP 138/78 | HR 75 | Ht 65.0 in | Wt 187.0 lb

## 2023-12-07 DIAGNOSIS — R109 Unspecified abdominal pain: Secondary | ICD-10-CM

## 2023-12-07 DIAGNOSIS — M6208 Separation of muscle (nontraumatic), other site: Secondary | ICD-10-CM

## 2023-12-07 NOTE — Patient Instructions (Addendum)
 We will get you scheduled for a CT scan to look for any possible hernia.    We have scheduled you for a CT Scan of your Abdomen and Pelvis with contrast. This has been scheduled at [facility] on 12/15/23. Please arrive there by 12:45 pm enter in through the [facility] entrance. If you need to reschedule your Scan, you may do so by calling [phone]. Please let us  know if you reschedule your scan as we have to get authorization from your insurance for this.        Diastasis Recti  Diastasis recti is a condition in which the muscles of the abdomen (rectus abdominis muscles) become thin and separate. The result is a wider space between the muscles of the right and left abdomen (abdominal muscles). This wider space between the muscles may cause a bulge in the middle of the abdomen. This bulge may be noticed when a person is straining or when he or she sits up after lying down. Diastasis recti can affect men and women. It is most common among pregnant women, babies, people with obesity, and people who have had abdominal surgery. Exercise or surgery may help correct this condition. What are the causes? Common causes of this condition include: Pregnancy. As the uterus grows in size, it puts pressure on the abdominal muscles, causing the muscles to separate. Obesity. Excess fat puts pressure on abdominal muscles. Weight lifting. Some exercises of the abdomen. Advanced age. Genetics. Having had surgery on the abdomen before. What increases the risk? This condition is more likely to develop in: Women. Newborns, especially newborns who are born early (prematurely). What are the signs or symptoms? Common symptoms of this condition include: A bulge in the middle of your abdomen. You will notice it most when you sit up or strain. Pain in your low back, hips, or the area between your hip bones (pelvis). Constipation. Being unable to control when you urinate (urinary  incontinence). Bloating. Poor posture. How is this diagnosed? This condition is diagnosed with a physical exam. During the exam, your health care provider will ask you to lie flat on your back and do a crunch or half sit-up. If you have diastasis recti, a bulge will appear lengthwise between your abdominal muscles in the center of your abdomen. Your health care provider will measure the gap between your muscles with one of the following: A medical device used to measure the space between two objects (caliper). A tape measure. CT scan. Ultrasound. Finger spaces. Your health care provider will measure the space using his or her fingers. How is this treated? If your muscle separation is not too large, you may not need treatment. However, if you are a woman who plans to become pregnant again, you should treat this condition before your next pregnancy. Treatment may include: Physical therapy exercises to strengthen and tighten your abdominal muscles. Lifestyle changes such as weight loss and exercise. Over-the-counter pain medicines as needed. Surgery to correct the separation. Follow these instructions at home: Activity Return to your normal activities as told by your health care provider. Ask your health care provider what activities are safe for you. Do exercises as told by your health care provider. Make sure you are doing your exercises and movements correctly when lifting weights or doing exercises using your abdominal muscles or the muscles in the center of your body that give stability (core muscles). Proper form can help to prevent this condition from happening again. General instructions If you are  overweight, ask your health care provider for help with weight loss. Losing even a small amount of weight can help to improve your diastasis recti. Take over-the-counter or prescription medicines only as told by your health care provider. Do not strain. Straining can make the separation worse.  Examples of straining include: Pushing hard to have a bowel movement, such as when you have constipation. Lifting heavy objects or lifting children. Standing up and sitting down. You may need to take these actions to prevent or treat constipation: Drink enough fluid to keep your urine pale yellow. Take over-the-counter or prescription medicines. Eat foods that are high in fiber, such as beans, whole grains, and fresh fruits and vegetables. Limit foods that are high in fat and processed sugars, such as fried or sweet foods. Keep all follow-up visits. This is important. Contact a health care provider if: You notice a new bulge in your abdomen. Get help right away if: You experience severe discomfort in your abdomen. You develop severe abdominal pain along with nausea, vomiting, or a fever. Summary Diastasis recti is a condition in which the muscles of the abdomen (rectus abdominismuscles) become thin and separate. You may notice a bulge in your abdomen because the space has widened between the muscles of the right and left abdomen. The most common symptom is a bulge in the middle of your abdomen. You will notice it most when you sit up or strain. This condition is diagnosed with a physical exam. If the muscle separation is not too big, you may not need treatment. Otherwise, you may need to do physical therapy or have surgery. This information is not intended to replace advice given to you by your health care provider. Make sure you discuss any questions you have with your health care provider. Document Revised: 09/21/2022 Document Reviewed: 09/21/2022 Elsevier Patient Education  2024 [employer].

## 2023-12-08 NOTE — Progress Notes (Signed)
 Patient ID: Melissa Wilkins, female   DOB: 07-Jan-1981, 43 y.o.   MRN: [id number]  HPI Melissa Wilkins is a 43 y.o. female seen in consultation at the request of Dr. Glenwood for diastasis recti. SHe was seen by Dr Marolyn 2021 and at that time she had minimal sxs. She reports a bulge after had a baby about 5 years ago, some intermittent discomofrt and mild pain, dull, worsening w valsalva. Prior C section. No current images  She is able to perform more than 4 METS w/o SOB or C/P SHe thinks she does not want more children but is not 100% sure  Hb 10 rest cbc nml, bmp nml and hb A1c 5.7 HPI  Past Medical History:  Diagnosis Date   Gestational diabetes    Trichomonas vaginitis    Vaginal Pap smear, abnormal    biopsy     Past Surgical History:  Procedure Laterality Date   CESAREAN SECTION N/A 08/18/2018   Procedure: CESAREAN SECTION;  Surgeon: Barbette Knock, MD;  Location: [facility];  Service: Obstetrics;  Laterality: N/A;    Family History  Problem Relation Age of Onset   Diabetes Maternal Aunt    Hyperlipidemia Maternal Aunt    Diabetes Maternal Grandmother    Hyperlipidemia Maternal Grandmother    Cancer Maternal Grandfather     Social History Social History   Tobacco Use   Smoking status: Never    Passive exposure: Never   Smokeless tobacco: Never  Vaping Use   Vaping status: Never Used  Substance Use Topics   Alcohol use: Not Currently    Comment: social drinker   Drug use: Never    No Known Allergies  No current outpatient medications on file.   No current facility-administered medications for this visit.     Review of Systems Full ROS  was asked and was negative except for the information on the HPI  Physical Exam Blood pressure 138/78, pulse 75, height 5' 5 (1.651 m), weight 187 lb (84.8 kg), last menstrual period 11/24/2023, SpO2 98%. CONSTITUTIONAL: NAD. EYES: Pupils are equal, round, Sclera are non-icteric. EARS, NOSE, MOUTH AND THROAT: The  oropharynx is clear. The oral mucosa is pink and moist. Hearing is intact to voice. LYMPH NODES:  Lymph nodes in the neck are normal. RESPIRATORY:  Lungs are clear. There is normal respiratory effort, with equal breath sounds bilaterally, and without pathologic use of accessory muscles. CARDIOVASCULAR: Heart is regular without murmurs, gallops, or rubs. GI: The abdomen is  soft, nontender, and nondistended.  Diastasis recti, I can not palpate a true hernia defect although is not easy to say w 100% certainty There are no palpable masses. There is no hepatosplenomegaly.  GU: Rectal deferred.   MUSCULOSKELETAL: Normal muscle strength and tone. No cyanosis or edema.   SKIN: Turgor is good and there are no pathologic skin lesions or ulcers. NEUROLOGIC: Motor and sensation is grossly normal. Cranial nerves are grossly intact. PSYCH:  Oriented to person, place and time. Affect is normal.  Data Reviewed I have personally reviewed the patient's imaging, laboratory findings and medical records.    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  Discussed with her in detail about her disease process.  We typically do not recommend repair of diastases recti in the absence of hernia.  On exam I am not 100% certain whether or not she has a umbilical hernia.  Discussed with her in detail about options of watchful waiting versus obtaining a CT  scan to evaluate abdominal wall anatomy.  She is okay with a CT scan of the abdomen and pelvis.  I will be happy to see her in a couple weeks.  We talk about potential repair of ventral hernias and concomitant plication of the diastases recti.  I also advised and Gens repair of diastases recti per se as this is only a purely cosmetic issue.  Disrupting native tissue may also lead to unnecessary complications.  She seems to understand the situation.  A copy of this report was sent to the referring provider I personally spent a total of 40 minutes  in the care of the patient today including performing a medically appropriate exam/evaluation, extensive review of records, counseling and educating, placing orders, referring and communicating with other health care professionals, documenting clinical information in the EHR, independently interpreting and reviewing images studies and coordinating care.    Laneta Luna, MD FACS General Surgeon 12/08/2023, 7:38 AM

## 2023-12-09 ENCOUNTER — Ambulatory Visit
Admission: RE | Admit: 2023-12-09 | Discharge: 2023-12-09 | Disposition: A | Discharge status: Home / Self Care | Source: Ambulatory Visit | Attending: Certified Nurse Midwife | Admitting: Certified Nurse Midwife

## 2023-12-09 DIAGNOSIS — Z1231 Encounter for screening mammogram for malignant neoplasm of breast: Secondary | ICD-10-CM

## 2023-12-13 ENCOUNTER — Encounter (HOSPITAL_BASED_OUTPATIENT_CLINIC_OR_DEPARTMENT_OTHER): Payer: Self-pay | Admitting: Certified Nurse Midwife

## 2023-12-14 ENCOUNTER — Encounter (HOSPITAL_BASED_OUTPATIENT_CLINIC_OR_DEPARTMENT_OTHER): Payer: Self-pay | Admitting: Certified Nurse Midwife

## 2023-12-14 ENCOUNTER — Other Ambulatory Visit (HOSPITAL_BASED_OUTPATIENT_CLINIC_OR_DEPARTMENT_OTHER): Payer: Self-pay

## 2023-12-14 DIAGNOSIS — N926 Irregular menstruation, unspecified: Secondary | ICD-10-CM

## 2023-12-15 ENCOUNTER — Ambulatory Visit (HOSPITAL_BASED_OUTPATIENT_CLINIC_OR_DEPARTMENT_OTHER): Admitting: Certified Nurse Midwife

## 2023-12-15 ENCOUNTER — Ambulatory Visit
Admission: RE | Admit: 2023-12-15 | Discharge: 2023-12-15 | Disposition: A | Discharge status: Home / Self Care | Source: Ambulatory Visit | Attending: Surgery | Admitting: Surgery

## 2023-12-15 DIAGNOSIS — M6208 Separation of muscle (nontraumatic), other site: Secondary | ICD-10-CM | POA: Diagnosis present

## 2023-12-15 DIAGNOSIS — R109 Unspecified abdominal pain: Secondary | ICD-10-CM | POA: Diagnosis present

## 2023-12-15 DIAGNOSIS — D259 Leiomyoma of uterus, unspecified: Secondary | ICD-10-CM | POA: Diagnosis not present

## 2023-12-15 DIAGNOSIS — K5900 Constipation, unspecified: Secondary | ICD-10-CM | POA: Diagnosis not present

## 2023-12-15 DIAGNOSIS — R1030 Lower abdominal pain, unspecified: Secondary | ICD-10-CM | POA: Diagnosis not present

## 2023-12-15 MED ORDER — IOHEXOL 300 MG/ML  SOLN
100.0000 mL | Freq: Once | INTRAMUSCULAR | Status: AC | PRN
  Administered 2023-12-15: 100 mL via INTRAVENOUS

## 2023-12-21 ENCOUNTER — Ambulatory Visit: Admitting: Surgery

## 2023-12-22 ENCOUNTER — Ambulatory Visit (HOSPITAL_BASED_OUTPATIENT_CLINIC_OR_DEPARTMENT_OTHER): Admitting: Certified Nurse Midwife

## 2023-12-22 ENCOUNTER — Encounter (HOSPITAL_BASED_OUTPATIENT_CLINIC_OR_DEPARTMENT_OTHER): Payer: Self-pay | Admitting: Certified Nurse Midwife

## 2023-12-22 VITALS — BP 147/87 | HR 85 | Ht 65.0 in | Wt 187.6 lb

## 2023-12-22 DIAGNOSIS — D649 Anemia, unspecified: Secondary | ICD-10-CM

## 2023-12-22 DIAGNOSIS — D259 Leiomyoma of uterus, unspecified: Secondary | ICD-10-CM | POA: Diagnosis not present

## 2023-12-22 DIAGNOSIS — N939 Abnormal uterine and vaginal bleeding, unspecified: Secondary | ICD-10-CM

## 2023-12-22 DIAGNOSIS — N9489 Other specified conditions associated with female genital organs and menstrual cycle: Secondary | ICD-10-CM

## 2023-12-22 DIAGNOSIS — N83201 Unspecified ovarian cyst, right side: Secondary | ICD-10-CM

## 2023-12-22 DIAGNOSIS — D219 Benign neoplasm of connective and other soft tissue, unspecified: Secondary | ICD-10-CM

## 2023-12-22 MED ORDER — NORETHINDRONE ACETATE 5 MG PO TABS: 5.0000 mg | ORAL_TABLET | Freq: Every day | ORAL | 0 refills | Status: AC

## 2023-12-22 NOTE — Progress Notes (Signed)
  Melissa Wilkins is a 43yo Para 1 here for follow-up/problem gyn visit. Pap smear 10/27/23 was Negative/Normal.  She was initially evaluated 2 months ago and reported a change in periods. Periods have become irregular with episodes of prolonged bleeding, episodes of heavy bleeding. Pt states as of today she has been bleeding for 30 straight days. When the problem first started, she tried a low-dose combination oral contraceptive that caused severe nausea. States she doesn't do well with taking pills by mouth. There is no risk of pregnancy or STI. She is not interested in a Mirena IUD. Pt willing to try a Progesterone-Only pill.  CT Imaging from 12/15/23 showed a small fibroid in uterine fundus measuring 2cm. Benign appearing cyst was present in right adnexa measuring 4 x 3.5cm.   The following portions of the patient's history were reviewed and updated as appropriate: allergies, current medications, past family history, past medical history, past social history, past surgical history, and problem list.   Review of Systems Pertinent items are noted in HPI.    Objective:    BP (!) 147/87   Pulse 85   Ht 5' 5 (1.651 m) Comment: pt reported  Wt 187 lb 9.6 oz (85.1 kg)   LMP 11/24/2023 (Exact Date)   BMI 31.22 kg/m  General appearance: alert, cooperative, appears stated age, and no distress    Assessment:    Abnormal Uterine Bleeding.  Anemia Uterine Fibroid Right adnexal cyst   Plan:    Pt would prefer to defer pelvic exam at this time due to bleeding. Agreeable to repeat US  to evaluate adnexal cyst/fibroids. Rx sent for Aygestin  5mg  po daily  Will have pt RTO for endometrial biopsy. Hg (11/24/23) 10.8-Mild anemia at that time  Melissa Wilkins

## 2023-12-23 ENCOUNTER — Ambulatory Visit (HOSPITAL_BASED_OUTPATIENT_CLINIC_OR_DEPARTMENT_OTHER): Admission: RE | Admit: 2023-12-23 | Source: Ambulatory Visit

## 2023-12-23 ENCOUNTER — Encounter (HOSPITAL_BASED_OUTPATIENT_CLINIC_OR_DEPARTMENT_OTHER): Payer: Self-pay | Admitting: Certified Nurse Midwife

## 2023-12-23 DIAGNOSIS — D219 Benign neoplasm of connective and other soft tissue, unspecified: Secondary | ICD-10-CM | POA: Insufficient documentation

## 2023-12-23 DIAGNOSIS — N939 Abnormal uterine and vaginal bleeding, unspecified: Secondary | ICD-10-CM | POA: Insufficient documentation

## 2023-12-23 DIAGNOSIS — N9489 Other specified conditions associated with female genital organs and menstrual cycle: Secondary | ICD-10-CM | POA: Insufficient documentation

## 2024-01-05 ENCOUNTER — Ambulatory Visit (HOSPITAL_BASED_OUTPATIENT_CLINIC_OR_DEPARTMENT_OTHER)
Admission: RE | Admit: 2024-01-05 | Discharge: 2024-01-05 | Attending: Certified Nurse Midwife | Admitting: Certified Nurse Midwife

## 2024-01-05 DIAGNOSIS — N9489 Other specified conditions associated with female genital organs and menstrual cycle: Secondary | ICD-10-CM | POA: Diagnosis present

## 2024-01-05 DIAGNOSIS — D219 Benign neoplasm of connective and other soft tissue, unspecified: Secondary | ICD-10-CM | POA: Insufficient documentation

## 2024-01-05 DIAGNOSIS — N939 Abnormal uterine and vaginal bleeding, unspecified: Secondary | ICD-10-CM | POA: Insufficient documentation

## 2024-01-05 DIAGNOSIS — N838 Other noninflammatory disorders of ovary, fallopian tube and broad ligament: Secondary | ICD-10-CM | POA: Diagnosis not present

## 2024-01-05 DIAGNOSIS — D259 Leiomyoma of uterus, unspecified: Secondary | ICD-10-CM | POA: Diagnosis not present

## 2024-01-09 ENCOUNTER — Ambulatory Visit: Admitting: Surgery

## 2024-01-13 ENCOUNTER — Ambulatory Visit (HOSPITAL_BASED_OUTPATIENT_CLINIC_OR_DEPARTMENT_OTHER): Payer: Self-pay | Admitting: Certified Nurse Midwife

## 2024-01-13 DIAGNOSIS — R9389 Abnormal findings on diagnostic imaging of other specified body structures: Secondary | ICD-10-CM | POA: Insufficient documentation

## 2024-01-13 NOTE — Progress Notes (Signed)
 Melissa Wilkins, Your ovaries both appeared normal on the ultrasound.  You have a small fibroid. Your endometrium was thick ('endometrial stripe).  We discussed that you would return to the office for the endometrial biopsy. This samples the endometrium to be certain it is normal. Please call our office and let them know that you need to schedule an Endometrial Biopsy/Follow-Up. Thank you. Arland MARLA Roller

## 2024-01-17 ENCOUNTER — Other Ambulatory Visit (HOSPITAL_BASED_OUTPATIENT_CLINIC_OR_DEPARTMENT_OTHER): Payer: Self-pay

## 2024-01-17 DIAGNOSIS — N939 Abnormal uterine and vaginal bleeding, unspecified: Secondary | ICD-10-CM

## 2024-01-17 MED ORDER — IBUPROFEN 800 MG PO TABS: 800.0000 mg | ORAL_TABLET | Freq: Three times a day (TID) | ORAL | 3 refills | Status: AC | PRN

## 2024-01-20 ENCOUNTER — Encounter (HOSPITAL_BASED_OUTPATIENT_CLINIC_OR_DEPARTMENT_OTHER): Payer: Self-pay | Admitting: Certified Nurse Midwife

## 2024-01-20 ENCOUNTER — Ambulatory Visit (HOSPITAL_BASED_OUTPATIENT_CLINIC_OR_DEPARTMENT_OTHER): Payer: Self-pay | Admitting: Certified Nurse Midwife

## 2024-01-20 ENCOUNTER — Other Ambulatory Visit (HOSPITAL_COMMUNITY)
Admission: RE | Admit: 2024-01-20 | Discharge: 2024-01-20 | Disposition: A | Discharge status: Home / Self Care | Source: Ambulatory Visit | Attending: Certified Nurse Midwife | Admitting: Certified Nurse Midwife

## 2024-01-20 VITALS — BP 122/70 | HR 87 | Ht 64.0 in | Wt 187.4 lb

## 2024-01-20 DIAGNOSIS — N939 Abnormal uterine and vaginal bleeding, unspecified: Secondary | ICD-10-CM | POA: Diagnosis present

## 2024-01-20 DIAGNOSIS — R9389 Abnormal findings on diagnostic imaging of other specified body structures: Secondary | ICD-10-CM

## 2024-01-20 NOTE — Progress Notes (Signed)
" °  Subjective:     Melissa Wilkins is a 43 y.o. female who presents for endometrial biopsy. Pt noted a change in her periods approximately July/August. States periods have become irregular with episodes of prolonged/extended bleeding. Pt states she initially tried a COC but it caused severe nausea and she did not tolerate it. She states she does not do well with pills. States no risk of pregnancy or STI exposure. Pt was agreeable to starting a Progesterone Only Pill and states the bleeding is now less but she is still bothered by it.  US  01/11/24 showed normal myometrial echotexture, intramural fibroid measures up to 1.9cm, endometrium measured 19.52mm. Pt was brought back for endometrial biopsy today. We also reviewed the option/availability of Mirena IUD if pt desires. She will consider.     The following portions of the patient's history were reviewed and updated as appropriate: allergies, current medications, past family history, past medical history, past social history, past surgical history, and problem list.   Review of Systems Pertinent items are noted in HPI.    Objective:    BP 122/70   Pulse 87   Ht 5' 4 (1.626 m) Comment: Reported  Wt 187 lb 6.4 oz (85 kg)   BMI 32.17 kg/m  General appearance: alert, cooperative, and appears stated age Pelvic: cervix normal in appearance, external genitalia normal, no cervical motion tenderness, uterus normal size, shape, and consistency, vagina normal without discharge, and pt bleeding lightly.     Assessment:    Encounter for endometrial biopsy.    Plan:    ENDOMETRIAL BIOPSY     The indications for endometrial biopsy were reviewed.   Risks of the biopsy including cramping, bleeding, infection, uterine perforation, inadequate specimen and need for additional procedures  were discussed. The patient states she understands and agrees to undergo procedure today. Consent was signed. Time out was performed.  A sterile speculum was  placed in the patient's vagina and the cervix was prepped with Betadine. A single-toothed tenaculum was placed on the anterior lip of the cervix to stabilize it. The 3 mm pipelle was introduced into the endometrial cavity without difficulty to a depth of 9 cm, and a moderate amount of tissue was obtained and sent to pathology. The instruments were removed from the patient's vagina. Minimal bleeding from the cervix was noted. The patient tolerated the procedure well. Routine post-procedure instructions were given to the patient. The patient will follow up to review the results and for further management.  I again reiterated the option for Mirena IUD with risks/benefits/insertion. Pt verbalized understanding and will call office if she desires to proceed with insertion of Mirena IUD for AUB.  Arland POUR Jahniah Pallas      "

## 2024-01-24 ENCOUNTER — Ambulatory Visit (HOSPITAL_BASED_OUTPATIENT_CLINIC_OR_DEPARTMENT_OTHER): Payer: Self-pay | Admitting: Certified Nurse Midwife

## 2024-01-24 LAB — SURGICAL PATHOLOGY

## 2024-01-31 ENCOUNTER — Other Ambulatory Visit (HOSPITAL_BASED_OUTPATIENT_CLINIC_OR_DEPARTMENT_OTHER): Payer: Self-pay | Admitting: Certified Nurse Midwife

## 2024-01-31 MED ORDER — LO LOESTRIN FE 1 MG-10 MCG / 10 MCG PO TABS: 1.0000 | ORAL_TABLET | Freq: Every day | ORAL | 3 refills | Status: AC

## 2024-03-06 ENCOUNTER — Encounter (HOSPITAL_BASED_OUTPATIENT_CLINIC_OR_DEPARTMENT_OTHER): Payer: Self-pay | Admitting: Certified Nurse Midwife

## 2024-03-06 ENCOUNTER — Encounter (HOSPITAL_BASED_OUTPATIENT_CLINIC_OR_DEPARTMENT_OTHER): Payer: Self-pay

## 2024-03-06 ENCOUNTER — Other Ambulatory Visit (HOSPITAL_COMMUNITY)
Admission: RE | Admit: 2024-03-06 | Discharge: 2024-03-06 | Disposition: A | Source: Ambulatory Visit | Attending: Obstetrics & Gynecology | Admitting: Obstetrics & Gynecology

## 2024-03-06 ENCOUNTER — Ambulatory Visit (HOSPITAL_BASED_OUTPATIENT_CLINIC_OR_DEPARTMENT_OTHER)

## 2024-03-06 VITALS — BP 144/88 | HR 80 | Wt 193.0 lb

## 2024-03-06 DIAGNOSIS — N898 Other specified noninflammatory disorders of vagina: Secondary | ICD-10-CM

## 2024-03-06 MED ORDER — METRONIDAZOLE 500 MG PO TABS: 500.0000 mg | ORAL_TABLET | Freq: Two times a day (BID) | ORAL | 0 refills | Status: AC

## 2024-03-06 NOTE — Progress Notes (Signed)
 NURSE VISIT- VAGINITIS/STD/POC  SUBJECTIVE:  Melissa Wilkins is a 44 y.o. G2P1001 GYN patientfemale here for a vaginal swab for vaginitis screening.  She reports the following symptoms: discharge described as white and odor for 2 days. Denies abnormal vaginal bleeding, significant pelvic pain, fever, or UTI symptoms.  OBJECTIVE:  There were no vitals taken for this visit.  Appears well, in no apparent distress  ASSESSMENT: Vaginal swab for vaginitis screening  PLAN: Self-collected vaginal probe for Bacterial Vaginosis, Yeast sent to lab Treatment: to be determined once results are received Follow-up as needed if symptoms persist/worsen, or new symptoms develop  Morna LOISE Quale, RN

## 2024-03-08 LAB — CERVICOVAGINAL ANCILLARY ONLY
Bacterial Vaginitis (gardnerella): NEGATIVE
Candida Glabrata: NEGATIVE
Candida Vaginitis: POSITIVE — AB
Comment: NEGATIVE
Comment: NEGATIVE
Comment: NEGATIVE
